# Patient Record
Sex: Male | Born: 1970 | Race: White | Hispanic: No | Marital: Single | Smoking: Current every day smoker
Health system: Southern US, Community
[De-identification: ages and names within clinical notes are randomized; demographics above are authoritative.]

## PROBLEM LIST (undated history)

## (undated) DIAGNOSIS — F209 Schizophrenia, unspecified: Secondary | ICD-10-CM

## (undated) DIAGNOSIS — G4730 Sleep apnea, unspecified: Secondary | ICD-10-CM

## (undated) DIAGNOSIS — E7800 Pure hypercholesterolemia, unspecified: Secondary | ICD-10-CM

## (undated) DIAGNOSIS — K219 Gastro-esophageal reflux disease without esophagitis: Secondary | ICD-10-CM

## (undated) DIAGNOSIS — F99 Mental disorder, not otherwise specified: Secondary | ICD-10-CM

## (undated) DIAGNOSIS — F32A Depression, unspecified: Secondary | ICD-10-CM

## (undated) DIAGNOSIS — J449 Chronic obstructive pulmonary disease, unspecified: Secondary | ICD-10-CM

## (undated) DIAGNOSIS — F319 Bipolar disorder, unspecified: Secondary | ICD-10-CM

## (undated) HISTORY — PX: CHOLECYSTECTOMY: SHX55

## (undated) HISTORY — PX: TONSILLECTOMY: SUR1361

---

## 2003-05-17 ENCOUNTER — Other Ambulatory Visit: Payer: Self-pay

## 2004-04-01 ENCOUNTER — Emergency Department: Payer: Self-pay | Admitting: Emergency Medicine

## 2009-09-05 ENCOUNTER — Inpatient Hospital Stay: Payer: Self-pay | Admitting: General Surgery

## 2009-09-05 IMAGING — CR DG CHOLANGIOGRAM OPERATIVE
1 series · 1 of 1 positions shown · non-contrast
Comparison: none

REASON FOR EXAM: lap chole with cholangiogram
COMMENTS:

[imported/digitized images]
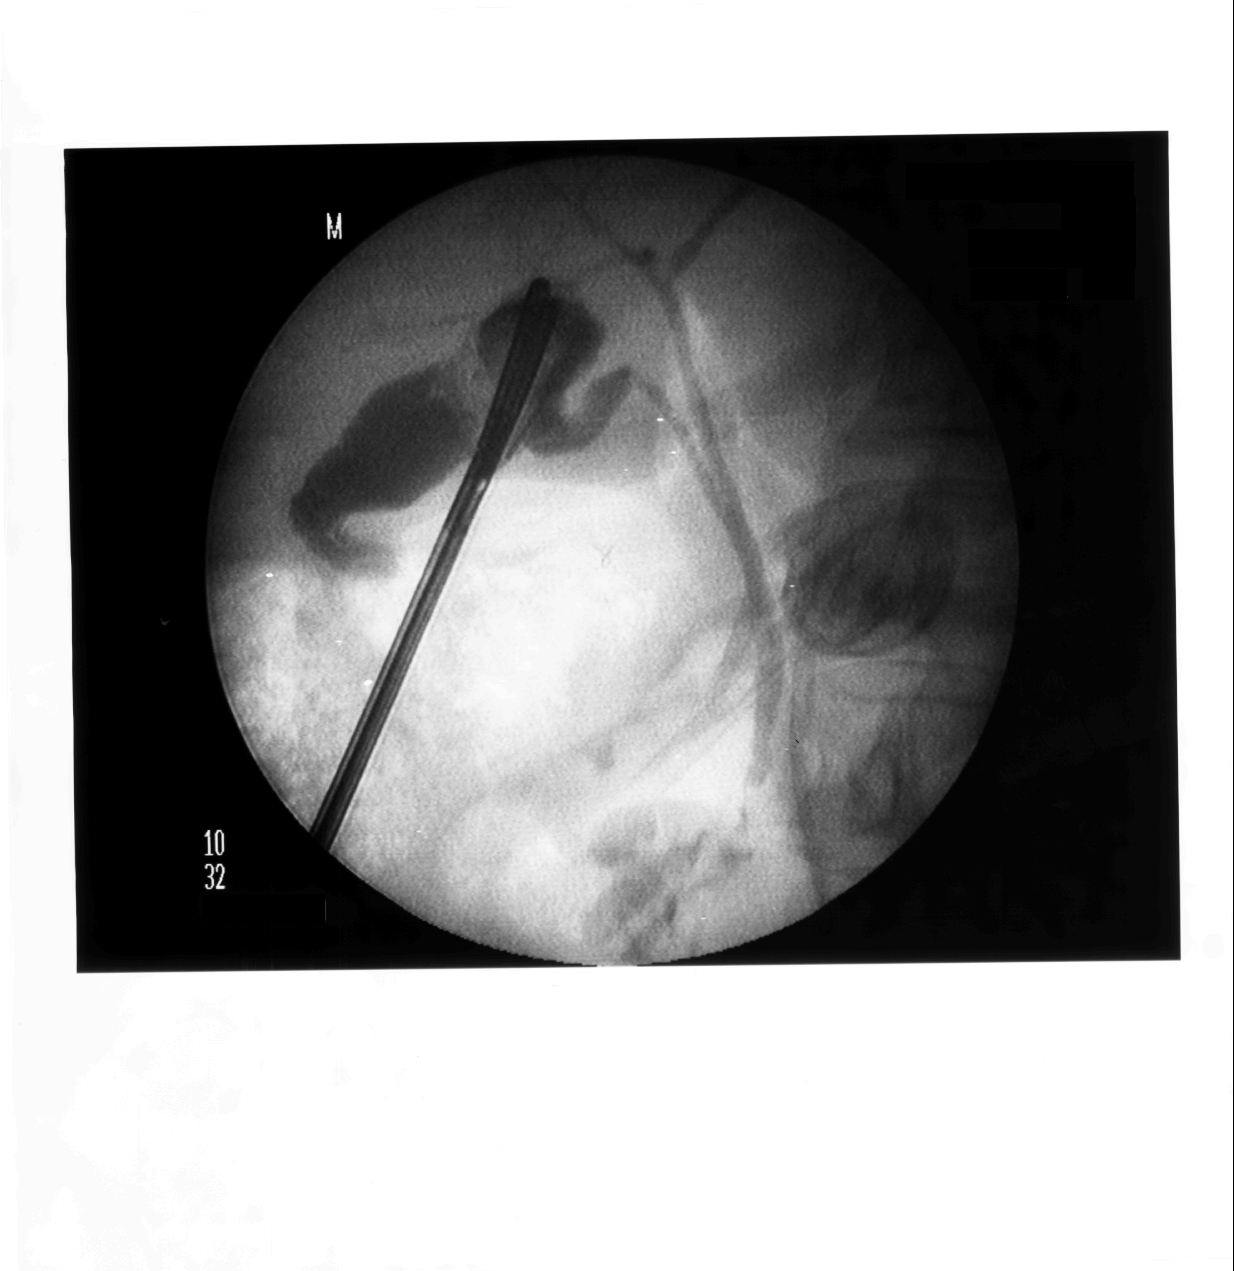

[1 of 1 positions shown; findings below may reference images not displayed]

PROCEDURE:     DXR - DXR CHOLANGIOGRAM OP (INITIAL)  - September 05, 2009  [DATE]

RESULT:     Laparoscopic cholangiogram demonstrates opacification of the
cystic duct, common hepatic duct, left and right hepatic ducts and common
bile duct. No definite filling defect or obstructive changes appreciated.
Contrast flows into the small bowel.
IMPRESSION: No obstruction or filling defect evident on the single
image submitted.

## 2011-06-30 IMAGING — CR DG ABDOMEN 3V
1 series · 5 of 5 positions shown · non-contrast
Comparison: none

REASON FOR EXAM: abd pain hx vomiting blood
COMMENTS:   May transport without cardiac monitor

[Series 1: view not recorded · 0.17mm/px · 5 of 5 slices shown]
[im 1/5]
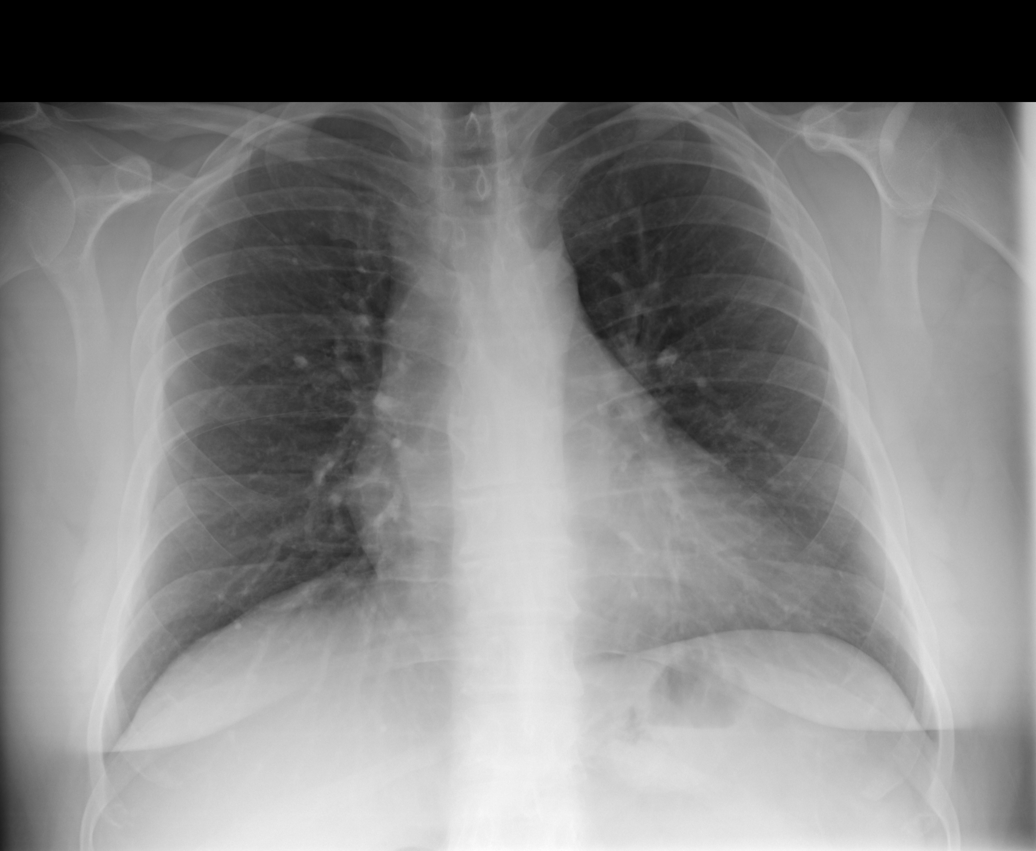
[im 2/5]
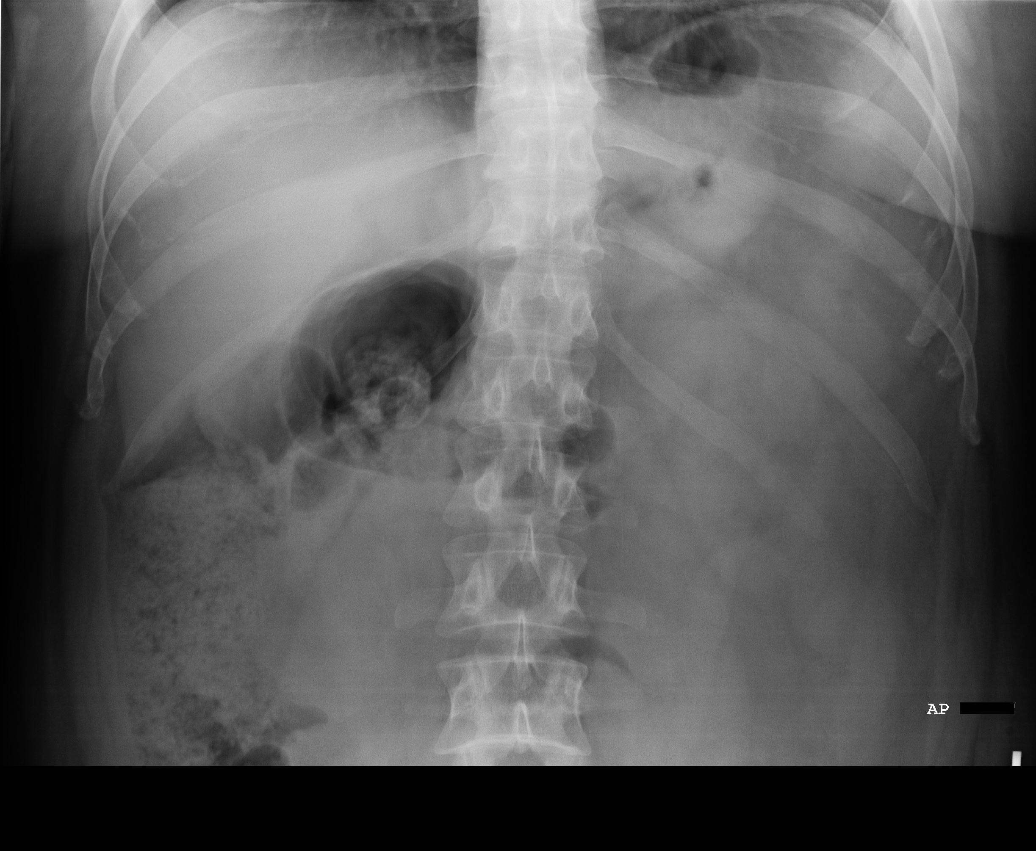
[im 3/5]
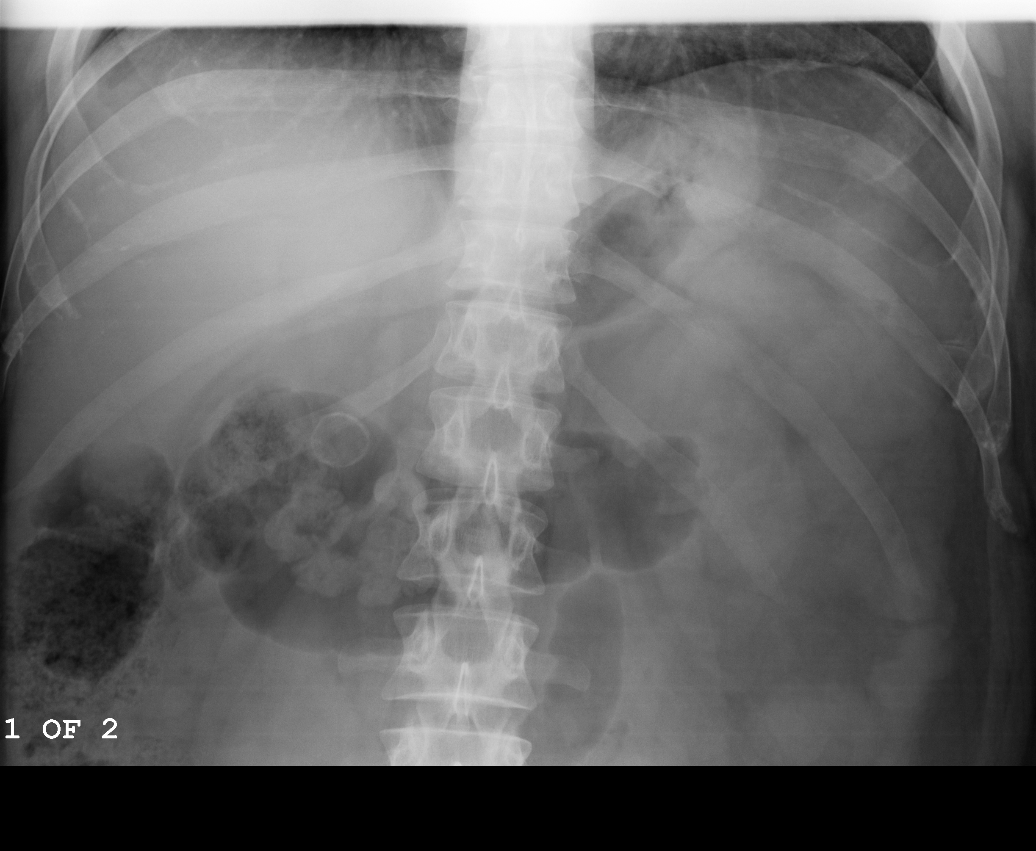
[im 4/5]
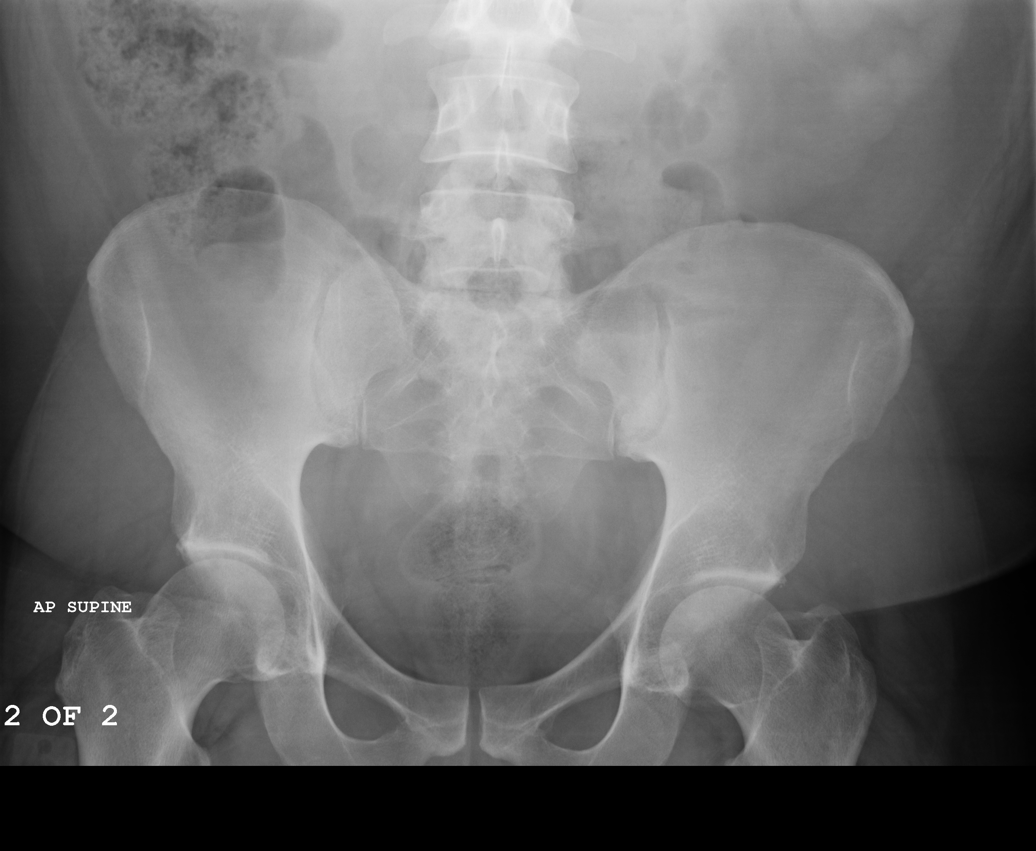
[im 5/5]
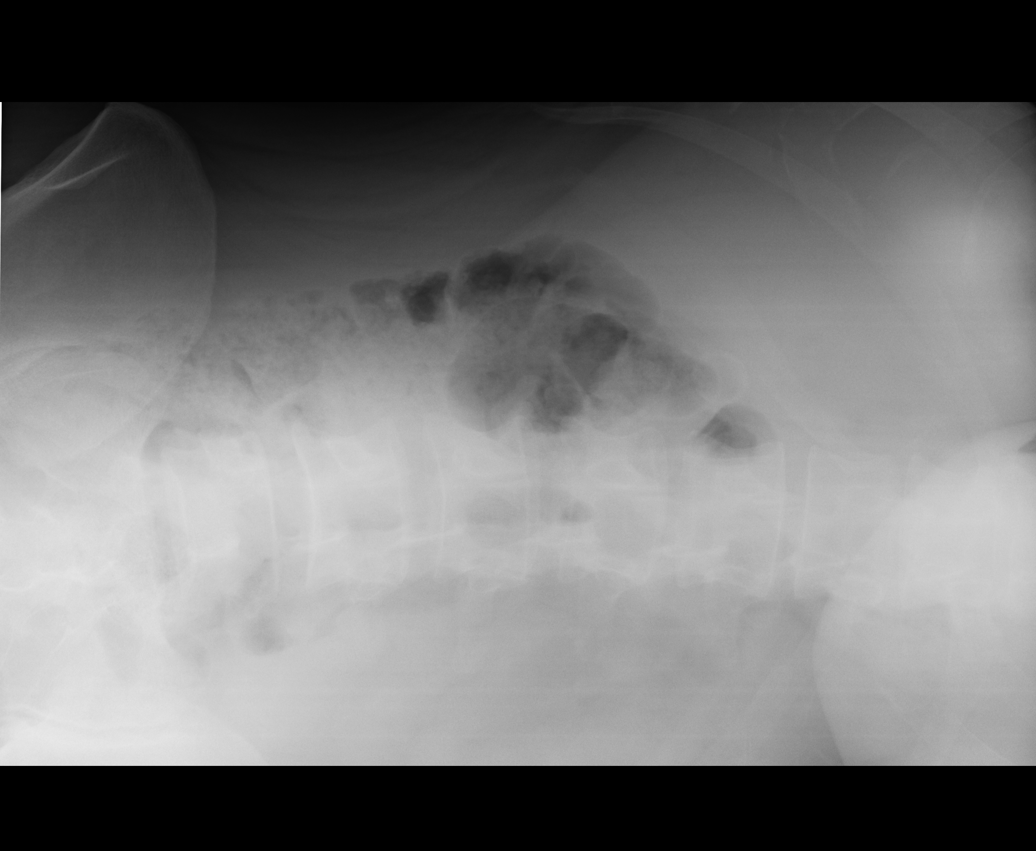

[5 of 5 positions shown; findings below may reference images not displayed]

PROCEDURE:     DXR - DXR ABDOMEN 3-WAY (INCL PA CXR)  - September 05, 2009 [DATE]

RESULT:     The lungs are clear. The cardiac silhouette appears to be
normal.

The bowel gas pattern shows a nondistended gastric bubble. There is a
moderately prominent amount of fecal material within the ascending colon and
transverse colon. There may be a well-circumscribed large gallstone
projecting over the twelfth rib. There is a small amount of air and stool in
the rectum. No abnormal small bowel distention or air-fluid level is seen
otherwise.
IMPRESSION: 1.     Possible cholelithiasis.
2.     Moderate amount of retained fecal material within the right colon. No
obstruction or perforation evident.

## 2011-06-30 IMAGING — US ABDOMEN ULTRASOUND LIMITED
1 series · 17 of 25 positions shown · non-contrast
Comparison: none

REASON FOR EXAM: abd pain, apparent gasll stone on xray
COMMENTS:

[Series 1: abdomen ultrasound limited · 17 of 52 slices shown]
[im 1/52]
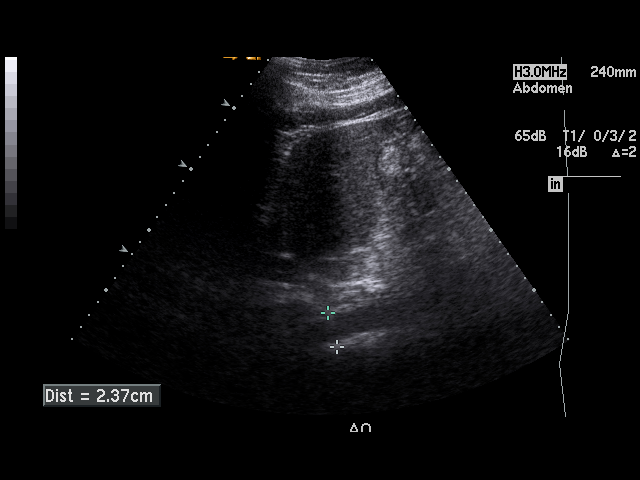
[im 5/52]
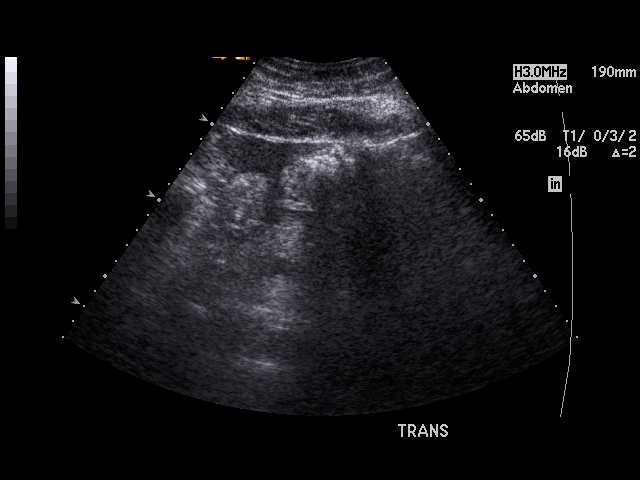
[im 7/52]
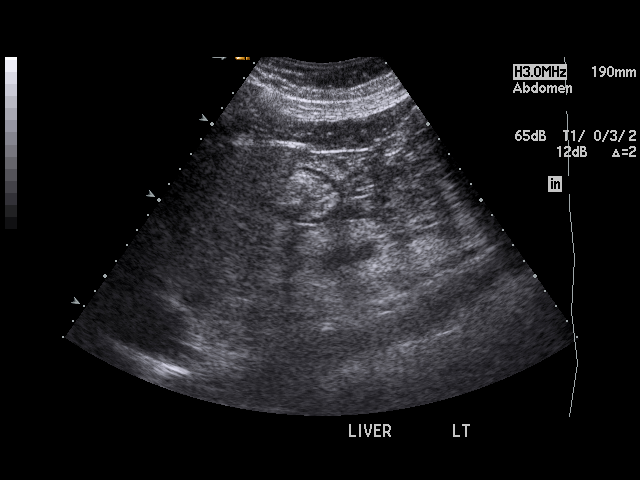
[im 11/52]
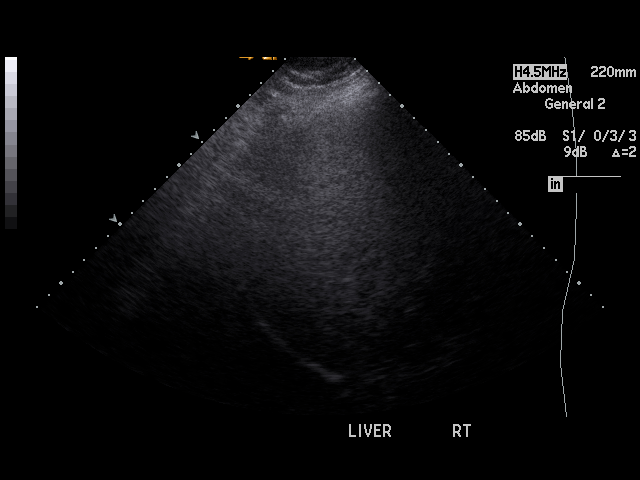
[im 13/52]
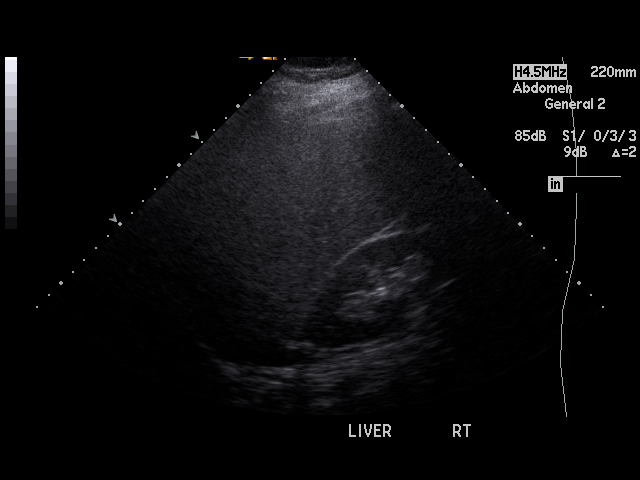
[im 18/52]
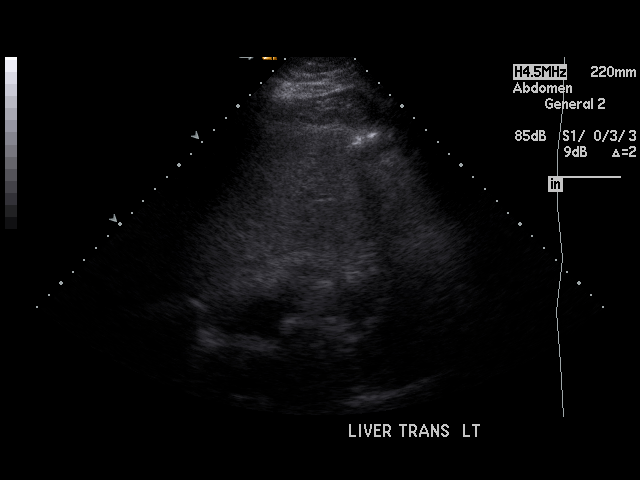
[im 20/52]
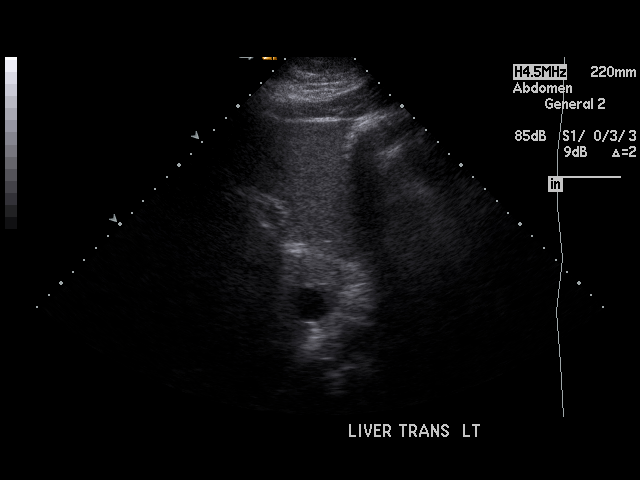
[im 24/52]
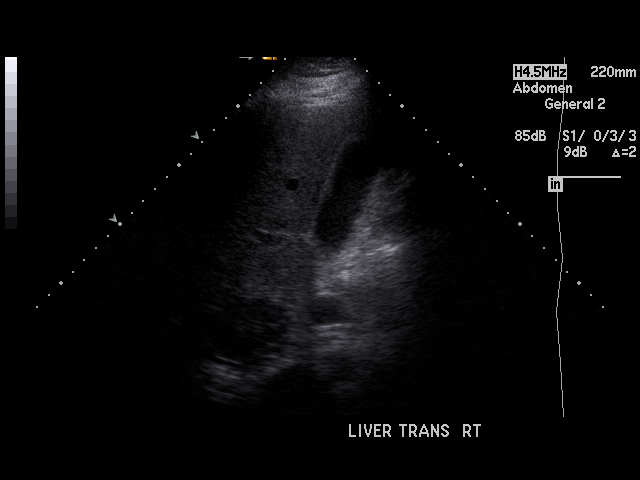
[im 26/52]
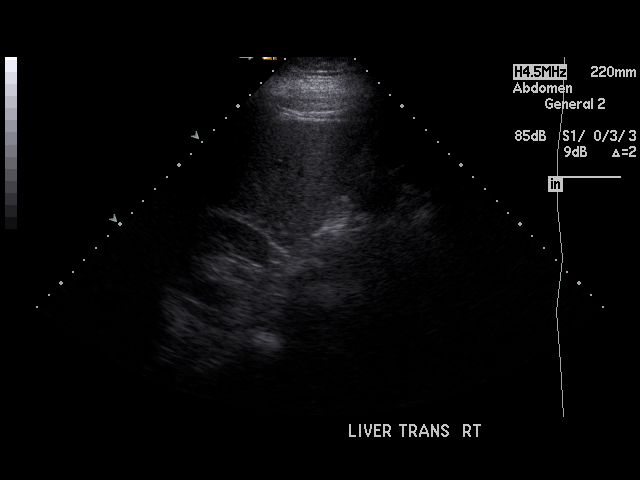
[im 28/52]
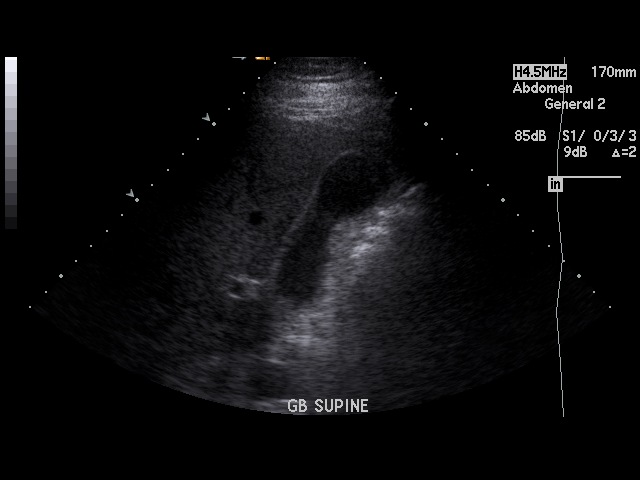
[im 32/52]
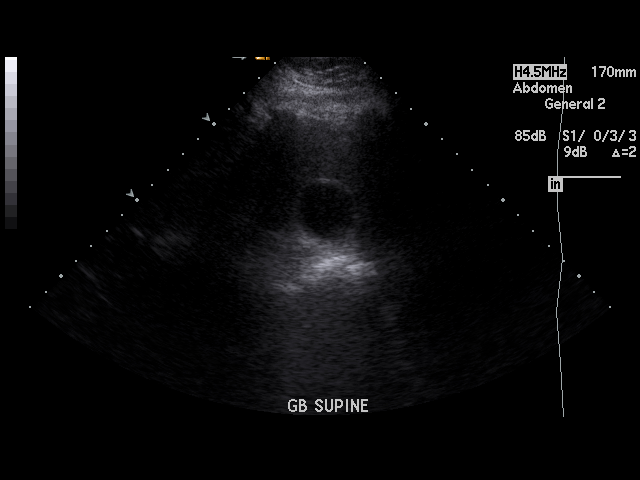
[im 35/52]
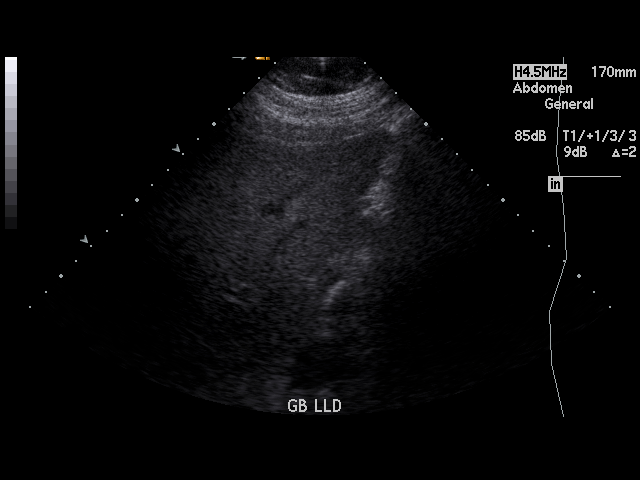
[im 39/52]
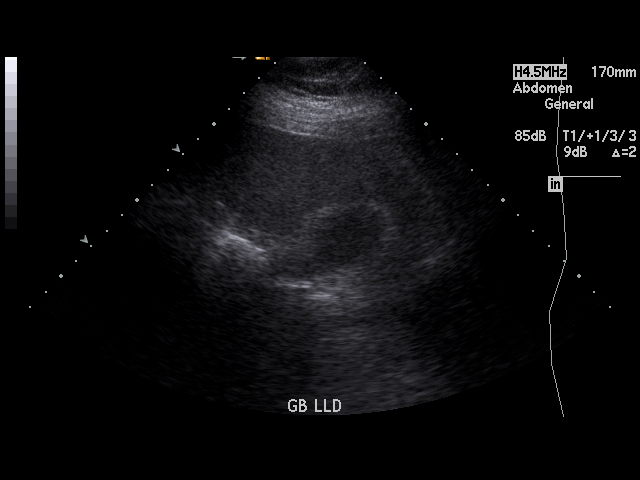
[im 41/52]
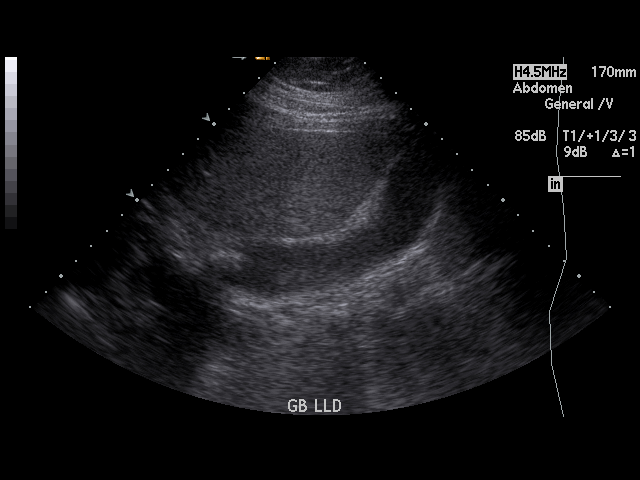
[im 45/52]
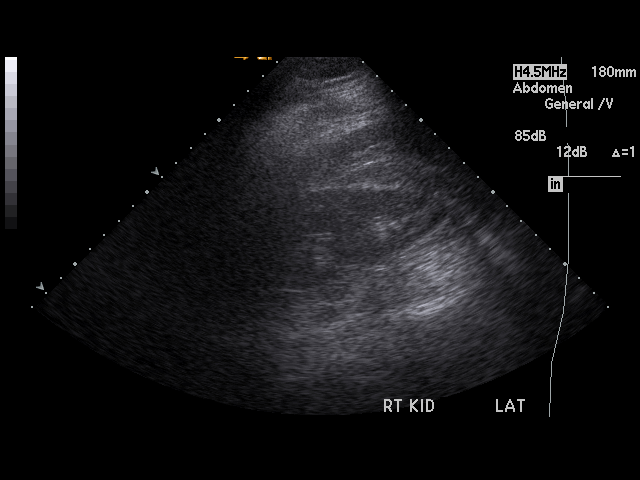
[im 47/52]
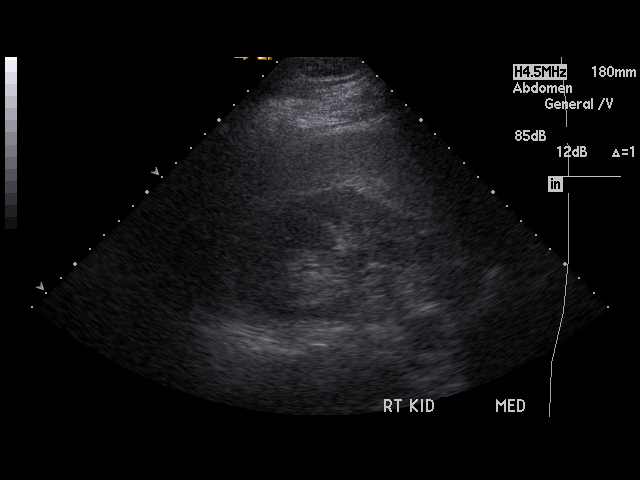
[im 52/52]
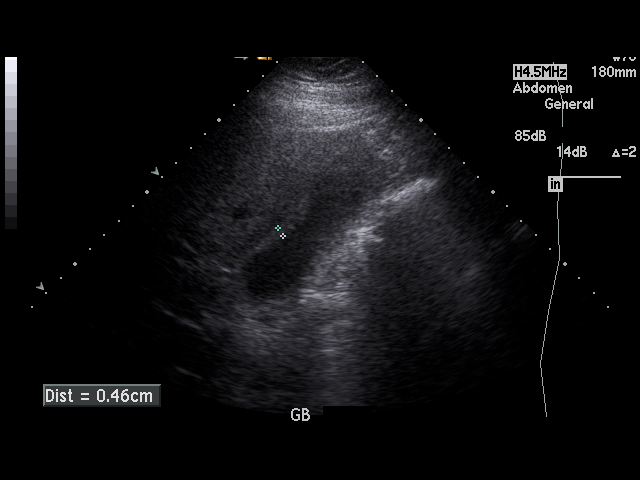

[17 of 25 positions shown; findings below may reference images not displayed]

PROCEDURE:     US  - US ABDOMEN LIMITED SURVEY  - September 05, 2009 [DATE]

RESULT:     Ultrasound of the right upper quadrant shows the visualized
portion of the abdominal aorta is normal area the pancreas is very poorly
seen because of overlying bowel gas. The liver echotexture is increased
consistent with diffuse fatty infiltration. There is a normal appearance of
the common bile duct. The diameters 2.7 mm. The gallbladder wall is
abnormally thickened from 4.2 to 4.6 mm. There is sludge present in the
gallbladder along with stones. No pericholecystic fluid is appreciated.
There is no sonographic Murphy's sign. There is a stone in the neck of the
gallbladder which did not move with changes in patient position. The right
kidney length is 12.36 cm. This appears normal. The portal venous flow
appears normal.
IMPRESSION: 1. Cholelithiasis possibly with an impacted stone in the gallbladder neck.
The wall is thickened. There is no pericholecystic fluid. No sonographic
Murphy's sign is present. There is increased echogenicity diffusely in the
liver consistent with fatty infiltration.

## 2015-01-30 ENCOUNTER — Encounter: Payer: Self-pay | Admitting: *Deleted

## 2015-01-30 ENCOUNTER — Emergency Department
Admission: EM | Admit: 2015-01-30 | Discharge: 2015-01-30 | Disposition: A | Discharge status: Home / Self Care | Payer: Medicaid Other | Attending: Emergency Medicine | Admitting: Emergency Medicine

## 2015-01-30 DIAGNOSIS — Y9289 Other specified places as the place of occurrence of the external cause: Secondary | ICD-10-CM | POA: Diagnosis not present

## 2015-01-30 DIAGNOSIS — Z23 Encounter for immunization: Secondary | ICD-10-CM | POA: Insufficient documentation

## 2015-01-30 DIAGNOSIS — Y998 Other external cause status: Secondary | ICD-10-CM | POA: Insufficient documentation

## 2015-01-30 DIAGNOSIS — Z720 Tobacco use: Secondary | ICD-10-CM | POA: Diagnosis not present

## 2015-01-30 DIAGNOSIS — W4904XA Ring or other jewelry causing external constriction, initial encounter: Secondary | ICD-10-CM | POA: Diagnosis not present

## 2015-01-30 DIAGNOSIS — S60444A External constriction of right ring finger, initial encounter: Secondary | ICD-10-CM | POA: Diagnosis not present

## 2015-01-30 DIAGNOSIS — S61214A Laceration without foreign body of right ring finger without damage to nail, initial encounter: Secondary | ICD-10-CM | POA: Insufficient documentation

## 2015-01-30 DIAGNOSIS — L089 Local infection of the skin and subcutaneous tissue, unspecified: Secondary | ICD-10-CM | POA: Diagnosis not present

## 2015-01-30 DIAGNOSIS — S6991XA Unspecified injury of right wrist, hand and finger(s), initial encounter: Secondary | ICD-10-CM | POA: Diagnosis present

## 2015-01-30 DIAGNOSIS — Y9389 Activity, other specified: Secondary | ICD-10-CM | POA: Diagnosis not present

## 2015-01-30 DIAGNOSIS — Q798 Other congenital malformations of musculoskeletal system: Secondary | ICD-10-CM

## 2015-01-30 DIAGNOSIS — S61219A Laceration without foreign body of unspecified finger without damage to nail, initial encounter: Secondary | ICD-10-CM

## 2015-01-30 HISTORY — DX: Mental disorder, not otherwise specified: F99

## 2015-01-30 MED ORDER — CEPHALEXIN 500 MG PO CAPS: 500.0000 mg | ORAL_CAPSULE | Freq: Four times a day (QID) | ORAL | Status: DC

## 2015-01-30 MED ORDER — TETANUS-DIPHTH-ACELL PERTUSSIS 5-2.5-18.5 LF-MCG/0.5 IM SUSP
0.5000 mL | Freq: Once | INTRAMUSCULAR | Status: AC
  Administered 2015-01-30: 0.5 mL via INTRAMUSCULAR
  Filled 2015-01-30: qty 0.5

## 2015-01-30 MED ORDER — TRAMADOL HCL 50 MG PO TABS: 50.0000 mg | ORAL_TABLET | Freq: Four times a day (QID) | ORAL | Status: AC | PRN

## 2015-01-30 MED ORDER — TETANUS-DIPHTH-ACELL PERTUSSIS 5-2.5-18.5 LF-MCG/0.5 IM SUSP
INTRAMUSCULAR | Status: AC
  Administered 2015-01-30: 0.5 mL via INTRAMUSCULAR
  Filled 2015-01-30: qty 0.5

## 2015-01-30 NOTE — ED Notes (Signed)
Patient has finger on right hand ring finger he put on there 2 weeks ago.  Patient has good circulation to his finger and rest of the hand, but the ring has made wound to palm at the base of the finger.

## 2015-01-30 NOTE — Discharge Instructions (Signed)
Warm soaks to hand twice a day. Clean daily with mild soap and water. Take all antibiotic until completely finished. Take pain medication only as directed. Follow-up with Dr. Doy Hutching next week.

## 2015-01-30 NOTE — ED Notes (Signed)
Dry dressing placed to right ring finger. Patient instructed to soak when he gets home and to keep it clean.  Discussed pain medication and antibiotic that was ordered as well.

## 2015-01-30 NOTE — ED Notes (Signed)
Pt has a ring on right ring finger, pt unable to remove ring due to swelling, pt is here for ring removal

## 2015-01-30 NOTE — ED Provider Notes (Signed)
[facility] Emergency Department Provider Note  ____________________________________________  Time seen: Approximately 9:21 AM  I have reviewed the triage vital signs and the nursing notes.   HISTORY  Chief Complaint Joint Swelling  HPI Cole Velazquez is a 44 y.o. male is here with complaint of right fourth finger pain. Patient states that his ring is cutting into his skin because his finger is swelling. This demonstrates waist for 2 weeks and he has not seen anybody prior to this.Patient states he was afraid to come to the emergency room but was encouraged by a friend to do so. He also states that he has not had a tetanus shot in over 10 years. Currently he reports that he is not have any pain unless someone touches his finger. He denies any fever or chills. He states that Dr. Doy Hutching is his PCP.   Past Medical History  Diagnosis Date  . Psychiatric problem     There are no active problems to display for this patient.   History reviewed. No pertinent past surgical history.  Current Outpatient Rx  Name  Route  Sig  Dispense  Refill  . cephALEXin (KEFLEX) 500 MG capsule   Oral   Take 1 capsule (500 mg total) by mouth 4 (four) times daily.   28 capsule   0   . traMADol (ULTRAM) 50 MG tablet   Oral   Take 1 tablet (50 mg total) by mouth every 6 (six) hours as needed.   20 tablet   0     Allergies Review of patient's allergies indicates no known allergies.  No family history on file.  Social History Social History  Substance Use Topics  . Smoking status: Current Every Day Smoker -- 0.50 packs/day  . Smokeless tobacco: None  . Alcohol Use: No    Review of Systems Constitutional: No fever/chills Cardiovascular: Denies chest pain. Respiratory: Denies shortness of breath. Gastrointestinal:  No nausea, no vomiting. Musculoskeletal: Negative for back pain. Skin: Negative for rash. Positive skin disruption secondary to ring. Neurological:  Negative for headaches, focal weakness or numbness. 10-point ROS otherwise negative.  ____________________________________________   PHYSICAL EXAM:  VITAL SIGNS: ED Triage Vitals  Enc Vitals Group     BP 01/30/15 0908 118/72 mmHg     Pulse Rate 01/30/15 0908 86     Resp 01/30/15 0908 20     Temp 01/30/15 0908 98 F (36.7 C)     Temp Source 01/30/15 0908 Oral     SpO2 01/30/15 0908 96 %     Weight 01/30/15 0908 230 lb (104.327 kg)     Height 01/30/15 0908 5\' 9"  (1.753 m)     Head Cir --      Peak Flow --      Pain Score --      Pain Loc --      Pain Edu? --      Excl. in [place]? --     Constitutional: Alert and oriented. Well appearing and in no acute distress. Eyes: Conjunctivae are normal. PERRL. EOMI. Head: Atraumatic. Nose: No congestion/rhinnorhea. Neck: No stridor.   Cardiovascular: Normal rate, regular rhythm. Grossly normal heart sounds.  Good peripheral circulation. Respiratory: Normal respiratory effort.  No retractions. Lungs CTAB. Gastrointestinal: Soft and nontender. No distention.  Musculoskeletal: Moves all extremities without any difficulty. Neurologic:  Normal speech and language. No gross focal neurologic deficits are appreciated. No gait instability. Skin:  Skin is warm. Right ring finger moderate edema present with a  class ring that cannot be taken off. There appears to be some superficial lacerations beneath the ring. Area is tender to deep palpation. There is minimal erythema present. Psychiatric: Mood and affect are normal. Speech and behavior are normal.  ____________________________________________   LABS (all labs ordered are listed, but only abnormal results are displayed)  Labs Reviewed - No data to display    PROCEDURES  Procedure(s) performed: None  Critical Care performed: No  ____________________________________________   INITIAL IMPRESSION / ASSESSMENT AND PLAN / ED COURSE  Pertinent labs & imaging results that were available  during my care of the patient were reviewed by me and considered in my medical decision making (see chart for details).  Ring was removed by [place] our tech with ring cutters. There was some purulent material underneath the ring. There are several superficial openings in the skin due to the ring cutting into his skin. Patient is placed on Keflex 500 mg 4 times a day for 7 days. He is also given a prescription for tramadol if needed for pain. He is to follow-up with Dr. Doy Hutching if any continued problems or return to the emergency room if any severe worsening of his symptoms. ____________________________________________   FINAL CLINICAL IMPRESSION(S) / ED DIAGNOSES  Final diagnoses:  Laceration of finger of right hand, initial encounter  Skin infection  Constriction ring of digit      Cole Hai, PA-C 01/30/15 1041  Delman Kitten, MD 01/30/15 [phone]

## 2019-08-23 ENCOUNTER — Emergency Department
Admission: EM | Admit: 2019-08-23 | Discharge: 2019-08-23 | Disposition: A | Discharge status: Home / Self Care | Payer: Medicaid Other | Attending: Emergency Medicine | Admitting: Emergency Medicine

## 2019-08-23 ENCOUNTER — Encounter: Payer: Self-pay | Admitting: Emergency Medicine

## 2019-08-23 ENCOUNTER — Other Ambulatory Visit: Payer: Self-pay

## 2019-08-23 ENCOUNTER — Emergency Department: Payer: Medicaid Other

## 2019-08-23 DIAGNOSIS — F17210 Nicotine dependence, cigarettes, uncomplicated: Secondary | ICD-10-CM | POA: Insufficient documentation

## 2019-08-23 DIAGNOSIS — B369 Superficial mycosis, unspecified: Secondary | ICD-10-CM | POA: Diagnosis not present

## 2019-08-23 DIAGNOSIS — N492 Inflammatory disorders of scrotum: Secondary | ICD-10-CM | POA: Diagnosis not present

## 2019-08-23 DIAGNOSIS — N433 Hydrocele, unspecified: Secondary | ICD-10-CM | POA: Diagnosis not present

## 2019-08-23 DIAGNOSIS — J449 Chronic obstructive pulmonary disease, unspecified: Secondary | ICD-10-CM | POA: Diagnosis not present

## 2019-08-23 DIAGNOSIS — N5089 Other specified disorders of the male genital organs: Secondary | ICD-10-CM

## 2019-08-23 DIAGNOSIS — N451 Epididymitis: Secondary | ICD-10-CM

## 2019-08-23 HISTORY — DX: Pure hypercholesterolemia, unspecified: E78.00

## 2019-08-23 HISTORY — DX: Chronic obstructive pulmonary disease, unspecified: J44.9

## 2019-08-23 HISTORY — DX: Bipolar disorder, unspecified: F31.9

## 2019-08-23 LAB — URINALYSIS, COMPLETE (UACMP) WITH MICROSCOPIC
Bacteria, UA: NONE SEEN
Bilirubin Urine: NEGATIVE
Glucose, UA: NEGATIVE mg/dL
Hgb urine dipstick: NEGATIVE
Ketones, ur: 5 mg/dL — AB
Nitrite: NEGATIVE
Protein, ur: NEGATIVE mg/dL
Specific Gravity, Urine: 1.017 (ref 1.005–1.030)
pH: 5 (ref 5.0–8.0)

## 2019-08-23 MED ORDER — CEPHALEXIN 500 MG PO CAPS: 500.0000 mg | ORAL_CAPSULE | Freq: Two times a day (BID) | ORAL | 0 refills | Status: DC

## 2019-08-23 MED ORDER — KETOCONAZOLE 2 % EX CREA: 1.0000 "application " | TOPICAL_CREAM | Freq: Every day | CUTANEOUS | 0 refills | Status: AC

## 2019-08-23 MED ORDER — CEPHALEXIN 500 MG PO CAPS
500.0000 mg | ORAL_CAPSULE | Freq: Four times a day (QID) | ORAL | 0 refills | Status: DC
Start: 2019-08-23 — End: 2019-08-27

## 2019-08-23 NOTE — ED Triage Notes (Addendum)
FIRST NURSE NOTE:Pt sent over from [facility] with c/o scrotal swelling and pain. Ambulatory without difficulty, c/o pain when sitting. Pt states injury occurred last week when moving heavy furniture.

## 2019-08-23 NOTE — ED Triage Notes (Signed)
Pt presents to ED via POV with c/o testicular swelling and pain. Pt states difficulty getting urine flow started. Pt denies other urinary symptoms. Pt states pain with movement. Pt states R testicle swelling more than left at this time. Pt visualized in NAD at this time.    VORB for UA and [place] from Dr. Corky Downs.

## 2019-08-23 NOTE — ED Notes (Signed)
Reports was lifting furniture on 5/5 and next day was having pain and swelling to right testicle. Patient reports did not come to have it looked at because he thought swelling and pain would resolve on its on. Pain has been consistent but denies increase in pain. U/s completed. patient awaiting results and plan of carr.

## 2019-08-23 NOTE — ED Provider Notes (Signed)
[facility] Emergency Department Provider Note   ____________________________________________    I have reviewed the triage vital signs and the nursing notes.   HISTORY  Chief Complaint Testicle Pain     HPI Cole Velazquez is a 49 y.o. male with history of bipolar disorder who presents with complaints of testicular swelling and pain. Patient describes swelling over the last several days. Denies injury to the area. No fevers or chills. Has not take anything for this. Describes swelling seems to be worse on the right side and he describes a tightness they are worse with pressure. Denies dysuria, no penile discharge. Has never had this before  Past Medical History:  Diagnosis Date  . Bipolar disorder ([place])   . COPD (chronic obstructive pulmonary disease) ([place])   . High cholesterol   . Psychiatric problem     There are no problems to display for this patient.   History reviewed. No pertinent surgical history.  Prior to Admission medications   Medication Sig Start Date End Date Taking? Authorizing Provider  cephALEXin (KEFLEX) 500 MG capsule Take 1 capsule (500 mg total) by mouth 4 (four) times daily. 01/30/15   [name], PA-C  traMADol (ULTRAM) 50 MG tablet Take 1 tablet (50 mg total) by mouth every 6 (six) hours as needed. 01/30/15   [name], PA-C     Allergies Patient has no known allergies.  No family history on file.  Social History Social History   Tobacco Use  . Smoking status: Current Every Day Smoker    Packs/day: 0.50  . Smokeless tobacco: Never Used  Substance Use Topics  . Alcohol use: No  . Drug use: Not on file    Review of Systems  Constitutional: No fever/chills Eyes: No visual changes.  ENT: No sore throat. Cardiovascular: Denies chest pain. Respiratory: Denies shortness of breath. Gastrointestinal: No abdominal pain.  No nausea, no vomiting.   Genitourinary: As above Musculoskeletal:  Negative for back pain. Skin: Negative for rash. Neurological: Negative for headaches   ____________________________________________   PHYSICAL EXAM:  VITAL SIGNS: ED Triage Vitals  Enc Vitals Group     BP 08/23/19 1104 124/82     Pulse Rate 08/23/19 1104 93     Resp 08/23/19 1104 16     Temp 08/23/19 1104 97.8 F (36.6 C)     Temp Source 08/23/19 1104 Oral     SpO2 08/23/19 1104 95 %     Weight 08/23/19 1104 117.9 kg (260 lb)     Height 08/23/19 1104 1.753 m (5\' 9" )     Head Circumference --      Peak Flow --      Pain Score 08/23/19 1112 4     Pain Loc --      Pain Edu? --      Excl. in [place]? --     Constitutional: Alert and oriented. Eyes: Conjunctivae are normal.   Mouth/Throat: Mucous membranes are moist.    Cardiovascular: Normal rate, regular rhythm. Grossly normal heart sounds.  Good peripheral circulation. Respiratory: Normal respiratory effort.  No retractions. Lungs CTAB. Gastrointestinal: Soft and nontender. No distention.  No CVA tenderness. Genitourinary: Mild scrotal swelling, right greater than left, mild erythema, mild tenderness on the right, no penile discharge, no crepitus, no abscess or fluctuance Musculoskeletal:  Warm and well perfused Neurologic:  Normal speech and language. No gross focal neurologic deficits are appreciated.  Skin:  Skin is warm, dry and intact.  Psychiatric: Mood and affect are normal. Speech and behavior are normal.  ____________________________________________   LABS (all labs ordered are listed, but only abnormal results are displayed)  Labs Reviewed  URINALYSIS, COMPLETE (UACMP) WITH MICROSCOPIC - Abnormal; Notable for the following components:      Result Value   Color, Urine YELLOW (*)    APPearance HAZY (*)    Ketones, ur 5 (*)    Leukocytes,Ua TRACE (*)    All other components within normal limits    ____________________________________________  EKG  None ____________________________________________  RADIOLOGY  Ultrasound testicles negative for acute finding ____________________________________________   PROCEDURES  Procedure(s) performed: No  Procedures   Critical Care performed: No ____________________________________________   INITIAL IMPRESSION / ASSESSMENT AND PLAN / ED COURSE  Pertinent labs & imaging results that were available during my care of the patient were reviewed by me and considered in my medical decision making (see chart for details).  Patient presents with swelling as described above. Differential includes epididymitis, orchitis, urinary tract infection, cellulitis.  Ultrasound is negative for acute findings, I suspect mild epididymitis given evidence of urinary tract infection. We will start the patient on Keflex to cover cellulitis and UTI. Close follow-up with urology. Strict return precautions discussed.    ____________________________________________   FINAL CLINICAL IMPRESSION(S) / ED DIAGNOSES  Final diagnoses:  Testicle swelling        Note:  This document was prepared using Dragon voice recognition software and may include unintentional dictation errors.   Lavonia Drafts, MD 08/23/19 1451

## 2019-08-26 NOTE — Progress Notes (Signed)
08/27/19 3:15 PM   Cole Velazquez 01/01/1971 [id number]  Referring provider: Idelle Crouch, MD [facility] [name],  [place] [address] No chief complaint on file.   HPI: Cole Velazquez is a 49 y.o. M who presents today for the evaluation and management of epididymitis.   Visited ED on 08/23/19 c/o of testicular swelling and pain. He described his swelling to be worse on the right side. No prior hx of this event.  He was treated w/ Keflex to cover cellulitis and UTI. No urine culture was sent.   F/u Scrotal US revealed large septated right sided hydrocele and right sided scrotal soft tissue swelling. Small left hydrocele noted.   He reports of testicular swelling and pain onset 2 weeks ago. He reports of still taking antibiotics from ED however not completely resolved. He reports of his exam not improving but his pain is. He denies gross hematuria or hx of diabetes.  PMH: Past Medical History:  Diagnosis Date  . Bipolar disorder ([place])   . COPD (chronic obstructive pulmonary disease) ([place])   . High cholesterol   . Psychiatric problem     Surgical History: No past surgical history on file.  Home Medications:  Allergies as of 08/27/2019   No Known Allergies     Medication List       Accurate as of Aug 27, 2019 11:59 PM. If you have any questions, ask your nurse or doctor.        ARIPiprazole 30 MG tablet Commonly known as: ABILIFY Take by mouth.   cephALEXin 500 MG capsule Commonly known as: KEFLEX Take 1 capsule (500 mg total) by mouth 2 (two) times daily. What changed: Another medication with the same name was removed. Continue taking this medication, and follow the directions you see here. Changed by: Hollice Espy, MD   ketoconazole 2 % cream Commonly known as: NIZORAL Apply 1 application topically daily.   Multi-Vitamin tablet Take by mouth.   sulfamethoxazole-trimethoprim 800-160 MG tablet Commonly known as: BACTRIM  DS Take 1 tablet by mouth every 12 (twelve) hours. Started by: Hollice Espy, MD   traMADol 50 MG tablet Commonly known as: Ultram Take 1 tablet (50 mg total) by mouth every 6 (six) hours as needed.   venlafaxine XR 150 MG 24 hr capsule Commonly known as: EFFEXOR-XR Take 150 mg by mouth every morning.       Allergies: No Known Allergies  Family History: No family history on file.  Social History:  reports that he has been smoking. He has been smoking about 0.50 packs per day. He has never used smokeless tobacco. He reports that he does not drink alcohol. No history on file for drug.   Physical Exam: BP 134/84   Pulse 95   Ht 5\' 9"  (1.753 m)   Wt 250 lb (113.4 kg)   BMI 36.92 kg/m   Constitutional:  Alert and oriented, No acute distress.  Hygiene suboptimal. HEENT: [place] AT, moist mucus membranes.  Trachea midline, no masses. Cardiovascular: No clubbing, cyanosis, or edema. Respiratory: Normal respiratory effort, no increased work of breathing. GU: No CVA tenderness. Right hemiscrotum enlarged w/ tense hydrocele and some slight induration of scrotal skin somewhat fixed on anterior surface to the hydrocele. No fluctuance or scrotal edema appreciated. No abscess appreciated. Left testicle is palpable.  Lymph: No inguinal lymphadenopathy. Skin: No rashes, bruises or suspicious lesions. Neurologic: Grossly intact, no focal deficits, moving all 4 extremities. Psychiatric: Normal mood and  affect.  Pertinent Imaging: CLINICAL DATA:  Right scrotal swelling for 10 days.  EXAM: SCROTAL ULTRASOUND  DOPPLER ULTRASOUND OF THE TESTICLES  TECHNIQUE: Complete ultrasound examination of the testicles, epididymis, and other scrotal structures was performed. Color and spectral Doppler ultrasound were also utilized to evaluate blood flow to the testicles.  COMPARISON:  None.  FINDINGS: Right testicle  Measurements: 4.0 x 3.1 x 2.7 cm. No mass or  microlithiasis visualized.  Left testicle  Measurements: 3.2 x 1.7 x 3.0 cm. No mass or microlithiasis visualized.  Right epididymis:  Not discretely visualized.  Left epididymis:  Not discretely visualized.  Hydrocele:  Large septated right hydrocele.  Small left hydrocele.  Varicocele:  None visualized.  Pulsed Doppler interrogation of both testes demonstrates normal low resistance arterial and venous waveforms bilaterally.  Right-sided scrotal soft tissue swelling. No visualized inguinal hernia.  IMPRESSION: 1. No acute finding. No evidence of testicular torsion or of epididymitis/orchitis. 2. No testicular masses. 3. Large septated right sided hydrocele and right-sided scrotal soft tissue swelling. Small left hydrocele. No other abnormalities. 4. No sonographic evidence of a hernia.   Electronically Signed   By: Lajean Manes M.D.   On: 08/23/2019 12:37  I have personally reviewed the images and agree with radiologist interpretation.  Results for orders placed or performed in visit on 08/27/19  Microscopic Examination   URINE  Result Value Ref Range   WBC, UA 0-5 0 - 5 /hpf   RBC 0-2 0 - 2 /hpf   Epithelial Cells (non renal) 0-10 0 - 10 /hpf   Casts Present (A) None seen /lpf   Cast Type Hyaline casts N/A   Bacteria, UA None seen None seen/Few  Urinalysis, Complete  Result Value Ref Range   Specific Gravity, UA 1.025 1.005 - 1.030   pH, UA 6.0 5.0 - 7.5   Color, UA Yellow Yellow   Appearance Ur Clear Clear   Leukocytes,UA Negative Negative   Protein,UA Negative Negative/Trace   Glucose, UA Negative Negative   Ketones, UA Negative Negative   RBC, UA Trace (A) Negative   Bilirubin, UA Negative Negative   Urobilinogen, Ur 2.0 (H) 0.2 - 1.0 mg/dL   Nitrite, UA Negative Negative   Microscopic Examination See below:      Assessment & Plan:    1. Scrotal cellulitis /right epididymoorchitis Switched abx from Keflex to Bactrim to cover  MRSA Continue scrotal support Symptomatically improving although exam is relatively stable based on patient's own report Very strict warning instructions given  Return in 2 weeks for recheck or sooner if symptoms worsen.   I have reviewed the above documentation for accuracy and completeness, and I agree with the above.     [facility] [address] [place], [place] [address] [phone]  I, Lucas Mallow, am acting as a scribe for Dr. Hollice Espy,  I have reviewed the above documentation for accuracy and completeness, and I agree with the above.   Hollice Espy, MD

## 2019-08-27 ENCOUNTER — Ambulatory Visit (INDEPENDENT_AMBULATORY_CARE_PROVIDER_SITE_OTHER): Payer: Medicaid Other | Admitting: Urology

## 2019-08-27 ENCOUNTER — Other Ambulatory Visit: Payer: Self-pay

## 2019-08-27 VITALS — BP 134/84 | HR 95 | Ht 69.0 in | Wt 250.0 lb

## 2019-08-27 DIAGNOSIS — N453 Epididymo-orchitis: Secondary | ICD-10-CM

## 2019-08-27 DIAGNOSIS — N492 Inflammatory disorders of scrotum: Secondary | ICD-10-CM

## 2019-08-27 MED ORDER — SULFAMETHOXAZOLE-TRIMETHOPRIM 800-160 MG PO TABS: 1.0000 | ORAL_TABLET | Freq: Two times a day (BID) | ORAL | 0 refills | Status: DC

## 2019-08-28 LAB — MICROSCOPIC EXAMINATION: Bacteria, UA: NONE SEEN

## 2019-08-28 LAB — URINALYSIS, COMPLETE
Bilirubin, UA: NEGATIVE
Glucose, UA: NEGATIVE
Ketones, UA: NEGATIVE
Leukocytes,UA: NEGATIVE
Nitrite, UA: NEGATIVE
Protein,UA: NEGATIVE
Specific Gravity, UA: 1.025 (ref 1.005–1.030)
Urobilinogen, Ur: 2 mg/dL — ABNORMAL HIGH (ref 0.2–1.0)
pH, UA: 6 (ref 5.0–7.5)

## 2019-09-10 NOTE — Progress Notes (Signed)
09/11/19  4:29 PM   Cole Velazquez 1970/08/02 [id number]  Referring provider: Idelle Crouch, MD [facility] [place],  [place] [address] Chief Complaint  Patient presents with  . cellulitis of scrotum    HPI: Cole Velazquez is a 49 y.o. M who presents today for a 2 week f/u for scrotal check.   Visited ED on 08/23/19 c/o of testicular swelling and pain. He described his swelling to be worse on the right side. No prior hx of this event.  He was treated w/ Keflex to cover cellulitis and UTI. No urine culture was sent. Scrotal US revealed large septated right sided hydrocele and right sided scrotal soft tissue swelling. Small left hydrocele noted.   He followed up in clinic with Dr. Erlene Quan on 08/27/2019.  He was switched to ED prescribed Keflex to Bactrim to cover MRSA and was advised to continue scrotal support with plans for symptom recheck in 2 weeks.  Today, patient reports significant improvement in his symptoms since his last clinic visit.  He reports resolution in pain and significantly decreased scrotal swelling that he attributes to antibiotics.  He denies fever, chills, nausea, and vomiting.  He has been inconsistently utilizing scrotal support techniques including compressive underwear and scrotal elevation.  He has no acute concerns today.  Additionally, patient reports no discomfort associated with his right hydrocele.  PMH: Past Medical History:  Diagnosis Date  . Bipolar disorder ([place])   . COPD (chronic obstructive pulmonary disease) ([place])   . High cholesterol   . Psychiatric problem     Surgical History: No past surgical history on file.  Home Medications:  Allergies as of 09/11/2019   No Known Allergies     Medication List       Accurate as of September 11, 2019  4:29 PM. If you have any questions, ask your nurse or doctor.        STOP taking these medications   cephALEXin 500 MG capsule Commonly known as: KEFLEX Stopped by:  Debroah Loop, PA-C   sulfamethoxazole-trimethoprim 800-160 MG tablet Commonly known as: BACTRIM DS Stopped by: Debroah Loop, PA-C     TAKE these medications   ARIPiprazole 30 MG tablet Commonly known as: ABILIFY Take by mouth.   ketoconazole 2 % cream Commonly known as: NIZORAL Apply 1 application topically daily.   Multi-Vitamin tablet Take by mouth.   traMADol 50 MG tablet Commonly known as: Ultram Take 1 tablet (50 mg total) by mouth every 6 (six) hours as needed.   venlafaxine XR 150 MG 24 hr capsule Commonly known as: EFFEXOR-XR Take 150 mg by mouth every morning.       Allergies: No Known Allergies  Family History: No family history on file.  Social History:  reports that he has been smoking. He has been smoking about 0.50 packs per day. He has never used smokeless tobacco. He reports that he does not drink alcohol. No history on file for drug.   Physical Exam: BP 138/88   Pulse 75   Ht 5\' 9"  (1.753 m)   Wt 250 lb (113.4 kg)   BMI 36.92 kg/m   Constitutional:  Alert and oriented, No acute distress. HEENT: [place] AT, moist mucus membranes.  Trachea midline, no masses. Cardiovascular: No clubbing, cyanosis, or edema. Respiratory: Normal respiratory effort, no increased work of breathing. GU: Enlarged right testicle consistent with hydrocele, scrotal skin remains fixed on the anterior surface of the hydrocele.  No fluctuance or  scrotal edema noted.  Scrotum is nontender throughout.  Left testicle also palpable. Skin: No rashes, bruises or suspicious lesions. Neurologic: Grossly intact, no focal deficits, moving all 4 extremities. Psychiatric: Normal mood and affect.  Assessment & Plan:   1. Cellulitis of scrotum Resolved, no further intervention indicated.  Counseled patient to follow-up as needed.  He expressed understanding.  2. Right hydrocele Counseled patient that hydrocele does not require intervention if it is not causing him  problems.  Explained that we may consider hydrocelectomy in the future if he develops discomfort associated with hydrocele.  He expressed understanding.  Return if symptoms worsen or fail to improve.  [place] [address] [place], [place] [address] [phone]

## 2019-09-11 ENCOUNTER — Ambulatory Visit (INDEPENDENT_AMBULATORY_CARE_PROVIDER_SITE_OTHER): Payer: Medicaid Other | Admitting: Physician Assistant

## 2019-09-11 ENCOUNTER — Other Ambulatory Visit: Payer: Self-pay

## 2019-09-11 VITALS — BP 138/88 | HR 75 | Ht 69.0 in | Wt 250.0 lb

## 2019-09-11 DIAGNOSIS — N492 Inflammatory disorders of scrotum: Secondary | ICD-10-CM

## 2019-09-11 DIAGNOSIS — N433 Hydrocele, unspecified: Secondary | ICD-10-CM | POA: Diagnosis not present

## 2020-07-09 LAB — COLOGUARD: COLOGUARD: POSITIVE — AB

## 2020-10-22 ENCOUNTER — Encounter: Payer: Self-pay | Admitting: *Deleted

## 2020-10-25 ENCOUNTER — Ambulatory Visit: Payer: Medicaid Other | Admitting: Anesthesiology

## 2020-10-25 ENCOUNTER — Ambulatory Visit
Admission: RE | Admit: 2020-10-25 | Discharge: 2020-10-25 | Disposition: A | Discharge status: Home / Self Care | Payer: Medicaid Other | Attending: Gastroenterology | Admitting: Gastroenterology

## 2020-10-25 ENCOUNTER — Encounter
Admission: RE | Disposition: A | Discharge status: Home / Self Care | Payer: Self-pay | Source: Home / Self Care | Attending: Gastroenterology

## 2020-10-25 DIAGNOSIS — K635 Polyp of colon: Secondary | ICD-10-CM | POA: Insufficient documentation

## 2020-10-25 DIAGNOSIS — J449 Chronic obstructive pulmonary disease, unspecified: Secondary | ICD-10-CM | POA: Diagnosis not present

## 2020-10-25 DIAGNOSIS — Z79899 Other long term (current) drug therapy: Secondary | ICD-10-CM | POA: Insufficient documentation

## 2020-10-25 DIAGNOSIS — Z9049 Acquired absence of other specified parts of digestive tract: Secondary | ICD-10-CM | POA: Diagnosis not present

## 2020-10-25 DIAGNOSIS — D125 Benign neoplasm of sigmoid colon: Secondary | ICD-10-CM | POA: Insufficient documentation

## 2020-10-25 DIAGNOSIS — K640 First degree hemorrhoids: Secondary | ICD-10-CM | POA: Insufficient documentation

## 2020-10-25 DIAGNOSIS — R195 Other fecal abnormalities: Secondary | ICD-10-CM | POA: Diagnosis present

## 2020-10-25 DIAGNOSIS — D123 Benign neoplasm of transverse colon: Secondary | ICD-10-CM | POA: Diagnosis not present

## 2020-10-25 HISTORY — DX: Depression, unspecified: F32.A

## 2020-10-25 HISTORY — PX: COLONOSCOPY WITH PROPOFOL: SHX5780

## 2020-10-25 HISTORY — DX: Schizophrenia, unspecified: F20.9

## 2020-10-25 HISTORY — DX: Sleep apnea, unspecified: G47.30

## 2020-10-25 HISTORY — DX: Gastro-esophageal reflux disease without esophagitis: K21.9

## 2020-10-25 SURGERY — COLONOSCOPY WITH PROPOFOL
Anesthesia: General

## 2020-10-25 MED ORDER — SODIUM CHLORIDE 0.9 % IV SOLN: INTRAVENOUS | Status: DC

## 2020-10-25 MED ORDER — PROPOFOL 500 MG/50ML IV EMUL
INTRAVENOUS | Status: DC | PRN
  Administered 2020-10-25: 200 ug/kg/min via INTRAVENOUS

## 2020-10-25 MED ORDER — PROPOFOL 10 MG/ML IV BOLUS
INTRAVENOUS | Status: DC | PRN
  Administered 2020-10-25: 60 mg via INTRAVENOUS
  Administered 2020-10-25: 20 mg via INTRAVENOUS

## 2020-10-25 NOTE — Op Note (Signed)
[facility] Gastroenterology Patient Name: Cole Velazquez Procedure Date: 10/25/2020 1:16 PM MRN: [id number] Account #: [id number] Date of Birth: 1970-08-11 Admit Type: Outpatient Age: 50 Room: [facility] ENDO ROOM 3 Gender: Male Note Status: Finalized Procedure:             Colonoscopy Indications:           Positive Cologuard test Providers:             Andrey Farmer MD, MD Medicines:             Monitored Anesthesia Care Complications:         No immediate complications. Estimated blood loss:                         Minimal. Procedure:             Pre-Anesthesia Assessment:                        - Prior to the procedure, a History and Physical was                         performed, and patient medications and allergies were                         reviewed. The patient is competent. The risks and                         benefits of the procedure and the sedation options and                         risks were discussed with the patient. All questions                         were answered and informed consent was obtained.                         Patient identification and proposed procedure were                         verified by the physician, the nurse, the anesthetist                         and the technician in the endoscopy suite. Mental                         Status Examination: alert and oriented. Airway                         Examination: normal oropharyngeal airway and neck                         mobility. Respiratory Examination: clear to                         auscultation. CV Examination: normal. Prophylactic                         Antibiotics: The patient does not require prophylactic  antibiotics. Prior Anticoagulants: The patient has                         taken no previous anticoagulant or antiplatelet                         agents. ASA Grade Assessment: III - A patient with                         severe systemic  disease. After reviewing the risks and                         benefits, the patient was deemed in satisfactory                         condition to undergo the procedure. The anesthesia                         plan was to use monitored anesthesia care (MAC).                         Immediately prior to administration of medications,                         the patient was re-assessed for adequacy to receive                         sedatives. The heart rate, respiratory rate, oxygen                         saturations, blood pressure, adequacy of pulmonary                         ventilation, and response to care were monitored                         throughout the procedure. The physical status of the                         patient was re-assessed after the procedure.                        After obtaining informed consent, the colonoscope was                         passed under direct vision. Throughout the procedure,                         the patient's blood pressure, pulse, and oxygen                         saturations were monitored continuously. The                         Colonoscope was introduced through the anus and                         advanced to the the cecum, identified by appendiceal  orifice and ileocecal valve. The colonoscopy was                         performed without difficulty. The patient tolerated                         the procedure well. The quality of the bowel                         preparation was adequate to identify polyps. Findings:      The perianal and digital rectal examinations were normal.      A 5 mm polyp was found in the hepatic flexure. The polyp was sessile.       The polyp was removed with a cold snare. Resection and retrieval were       complete. Estimated blood loss was minimal.      Two semi-pedunculated polyps were found in the sigmoid colon. The polyps       were 4 to 5 mm in size. These polyps were removed  with a cold snare.       Resection and retrieval were complete.      A 10 mm polyp was found in the sigmoid colon. The polyp was       pedunculated. The polyp was removed with a hot snare. Resection and       retrieval were complete. Estimated blood loss was minimal.      A 2 mm polyp was found in the recto-sigmoid colon. The polyp was       sessile. The polyp was removed with a cold snare. Resection and       retrieval were complete. Estimated blood loss was minimal.      Internal hemorrhoids were found during retroflexion. The hemorrhoids       were Grade I (internal hemorrhoids that do not prolapse).      The exam was otherwise without abnormality on direct and retroflexion       views. Impression:            - One 5 mm polyp at the hepatic flexure, removed with                         a cold snare. Resected and retrieved.                        - Two 4 to 5 mm polyps in the sigmoid colon, removed                         with a cold snare. Resected and retrieved.                        - One 10 mm polyp in the sigmoid colon, removed with a                         hot snare. Resected and retrieved.                        - One 2 mm polyp at the recto-sigmoid colon, removed                         with  a cold snare. Resected and retrieved.                        - Internal hemorrhoids.                        - The examination was otherwise normal on direct and                         retroflexion views. Recommendation:        - Discharge patient to home.                        - Resume previous diet.                        - Continue present medications.                        - Await pathology results.                        - Repeat colonoscopy in 3 years for surveillance.                        - Return to referring physician as previously                         scheduled. Procedure Code(s):     --- Professional ---                        [phone], Colonoscopy, flexible; with removal  of                         tumor(s), polyp(s), or other lesion(s) by snare                         technique Diagnosis Code(s):     --- Professional ---                        K63.5, Polyp of colon                        K64.0, First degree hemorrhoids                        R19.5, Other fecal abnormalities CPT copyright 2019 American Medical Association. All rights reserved. The codes documented in this report are preliminary and upon coder review may  be revised to meet current compliance requirements. Andrey Farmer MD, MD 10/25/2020 2:17:10 PM Number of Addenda: 0 Note Initiated On: 10/25/2020 1:16 PM Scope Withdrawal Time: 0 hours 21 minutes 38 seconds  Total Procedure Duration: 0 hours 30 minutes 3 seconds  Estimated Blood Loss:  Estimated blood loss was minimal.      [facility]

## 2020-10-25 NOTE — Transfer of Care (Signed)
Immediate Anesthesia Transfer of Care Note  Patient: Cole Velazquez  Procedure(s) Performed: COLONOSCOPY WITH PROPOFOL  Patient Location: PACU  Anesthesia Type:General  Level of Consciousness: awake, alert  and oriented  Airway & Oxygen Therapy: Patient Spontanous Breathing and Patient connected to face mask oxygen  Post-op Assessment: Report given to RN and Post -op Vital signs reviewed and stable  Post vital signs: Reviewed and stable  Last Vitals:  Vitals Value Taken Time  BP    Temp    Pulse    Resp    SpO2      Last Pain:  Vitals:   10/25/20 1239  TempSrc: Tympanic  PainSc: 1          Complications: No notable events documented.

## 2020-10-25 NOTE — Interval H&P Note (Signed)
History and Physical Interval Note:  10/25/2020 1:28 PM  Cole Velazquez  has presented today for surgery, with the diagnosis of Positive cologuard.  The various methods of treatment have been discussed with the patient and family. After consideration of risks, benefits and other options for treatment, the patient has consented to  Procedure(s): COLONOSCOPY WITH PROPOFOL (N/A) as a surgical intervention.  The patient's history has been reviewed, patient examined, no change in status, stable for surgery.  I have reviewed the patient's chart and labs.  Questions were answered to the patient's satisfaction.     [name]  Ok to proceed with colonoscopy

## 2020-10-25 NOTE — Anesthesia Preprocedure Evaluation (Signed)
Anesthesia Evaluation  Patient identified by MRN, date of birth, ID band Patient awake    Reviewed: Allergy & Precautions, NPO status , Patient's Chart, lab work & pertinent test results  Airway Mallampati: III  TM Distance: >3 FB Neck ROM: full    Dental  (+) Chipped   Pulmonary Current SmokerPatient did not abstain from smoking.,    Pulmonary exam normal        Cardiovascular negative cardio ROS Normal cardiovascular exam     Neuro/Psych Depression negative neurological ROS     GI/Hepatic negative GI ROS, Neg liver ROS,   Endo/Other  negative endocrine ROS  Renal/GU negative Renal ROS  negative genitourinary   Musculoskeletal   Abdominal   Peds  Hematology negative hematology ROS (+)   Anesthesia Other Findings Past Medical History: No date: Bipolar disorder ([place]) No date: COPD (chronic obstructive pulmonary disease) ([facility]) No date: Depression No date: GERD (gastroesophageal reflux disease) No date: High cholesterol No date: Psychiatric problem No date: Schizophrenia ([place]) No date: Sleep apnea  Past Surgical History: No date: CHOLECYSTECTOMY No date: TONSILLECTOMY  BMI    Body Mass Index: 35.44 kg/m      Reproductive/Obstetrics negative OB ROS                             Anesthesia Physical Anesthesia Plan  ASA: 2  Anesthesia Plan: General   Post-op Pain Management:    Induction:   PONV Risk Score and Plan: 2 and Propofol infusion and TIVA  Airway Management Planned:   Additional Equipment:   Intra-op Plan:   Post-operative Plan:   Informed Consent: I have reviewed the patients History and Physical, chart, labs and discussed the procedure including the risks, benefits and alternatives for the proposed anesthesia with the patient or authorized representative who has indicated his/her understanding and acceptance.     Dental Advisory Given  Plan Discussed  with: Anesthesiologist, CRNA and Surgeon  Anesthesia Plan Comments:         Anesthesia Quick Evaluation

## 2020-10-25 NOTE — H&P (Signed)
Outpatient short stay form Pre-procedure 10/25/2020 1:24 PM Raylene Miyamoto MD, MPH  Primary Physician: Dr. Doy Hutching  Reason for visit:  Positive Cologuard  History of present illness:   50 y/o gentleman with history of COPD here for colonoscopy due to positive cologuard test. No family history of GI malignancies. No blood thinners. History of cholecystectomy.    Current Facility-Administered Medications:    0.9 %  sodium chloride infusion, , Intravenous, Continuous, Gaylene Moylan, Hilton Cork, MD, Last Rate: 20 mL/hr at 10/25/20 1254, New Bag at 10/25/20 1254  Medications Prior to Admission  Medication Sig Dispense Refill Last Dose   ARIPiprazole (ABILIFY) 30 MG tablet Take by mouth.   10/25/2020 at 0600   Multiple Vitamin (MULTI-VITAMIN) tablet Take by mouth.   Past Week   venlafaxine XR (EFFEXOR-XR) 150 MG 24 hr capsule Take 150 mg by mouth every morning.   10/24/2020   ketoconazole (NIZORAL) 2 % cream Apply 1 application topically daily. (Patient not taking: Reported on 10/25/2020) 15 g 0 Completed Course   traMADol (ULTRAM) 50 MG tablet Take 1 tablet (50 mg total) by mouth every 6 (six) hours as needed. (Patient not taking: Reported on 10/25/2020) 20 tablet 0 Not Taking     No Known Allergies   Past Medical History:  Diagnosis Date   Bipolar disorder ([facility])    COPD (chronic obstructive pulmonary disease) ([facility])    Depression    GERD (gastroesophageal reflux disease)    High cholesterol    Psychiatric problem    Schizophrenia ([place])    Sleep apnea     Review of systems:  Otherwise negative.    Physical Exam  Gen: Alert, oriented. Appears stated age.  HEENT: [place]. Lungs: No respiratory distress CV: RRR Abd: soft, benign, no masses Ext: No edema    Planned procedures: Proceed with colonoscopy. The patient understands the nature of the planned procedure, indications, risks, alternatives and potential complications including but not limited to bleeding, infection, perforation,  damage to internal organs and possible oversedation/side effects from anesthesia. The patient agrees and gives consent to proceed.  Please refer to procedure notes for findings, recommendations and patient disposition/instructions.     Raylene Miyamoto MD, MPH Gastroenterology 10/25/2020  1:24 PM

## 2020-10-25 NOTE — Anesthesia Postprocedure Evaluation (Signed)
Anesthesia Post Note  Patient: Cole Velazquez  Procedure(s) Performed: COLONOSCOPY WITH PROPOFOL  Patient location during evaluation: Endoscopy Anesthesia Type: General Level of consciousness: awake and alert Pain management: pain level controlled Vital Signs Assessment: post-procedure vital signs reviewed and stable Respiratory status: spontaneous breathing, nonlabored ventilation, respiratory function stable and patient connected to nasal cannula oxygen Cardiovascular status: blood pressure returned to baseline and stable Postop Assessment: no apparent nausea or vomiting Anesthetic complications: no   No notable events documented.   Last Vitals:  Vitals:   10/25/20 1426 10/25/20 1436  BP: 113/78 113/80  Pulse: 71 68  Resp: 14 15  Temp:    SpO2: 98% 100%    Last Pain:  Vitals:   10/25/20 1436  TempSrc:   PainSc: 0-No pain                 Arita Miss

## 2020-10-26 ENCOUNTER — Encounter: Payer: Self-pay | Admitting: Gastroenterology

## 2020-10-27 LAB — SURGICAL PATHOLOGY

## 2021-06-16 IMAGING — US US SCROTUM W/ DOPPLER COMPLETE
1 series · 13 of 25 positions shown · non-contrast
Comparison: None.

CLINICAL DATA: Right scrotal swelling for 10 days.

EXAM:
SCROTAL ULTRASOUND
DOPPLER ULTRASOUND OF THE TESTICLES
TECHNIQUE: Complete ultrasound examination of the testicles, epididymis, and
other scrotal structures was performed. Color and spectral Doppler
ultrasound were also utilized to evaluate blood flow to the
testicles.

[Series 1: us scrotum w/doppler · 13 of 56 slices shown]
[im 1/56]
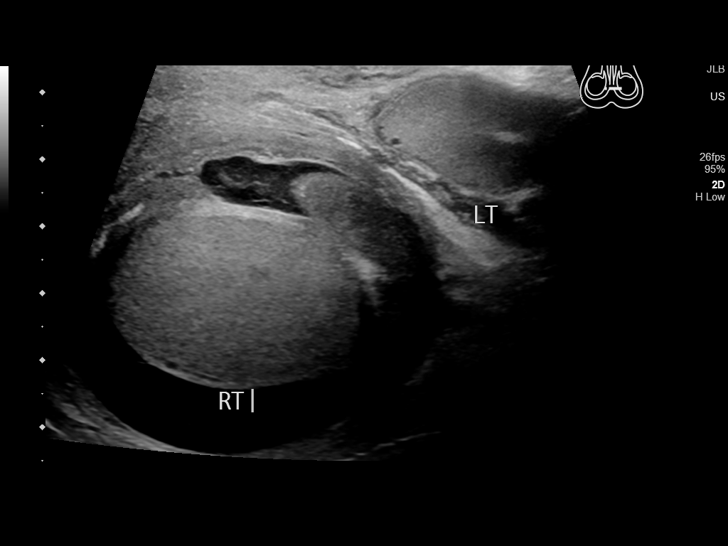
[im 5/56]
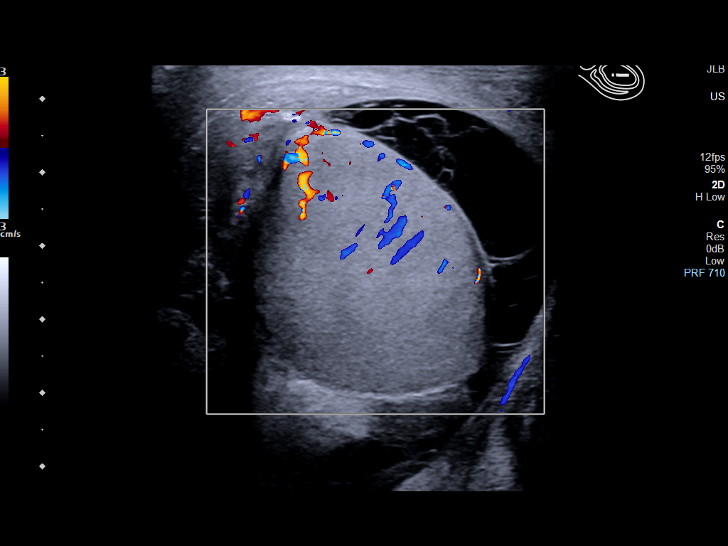
[im 10/56]
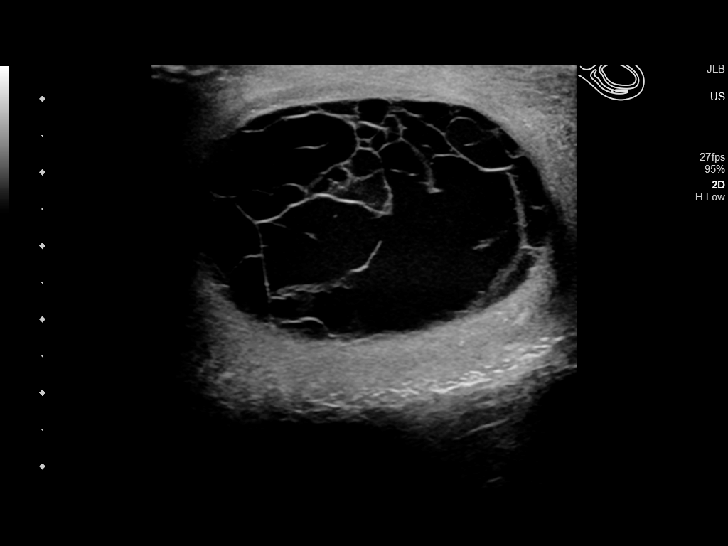
[im 14/56]
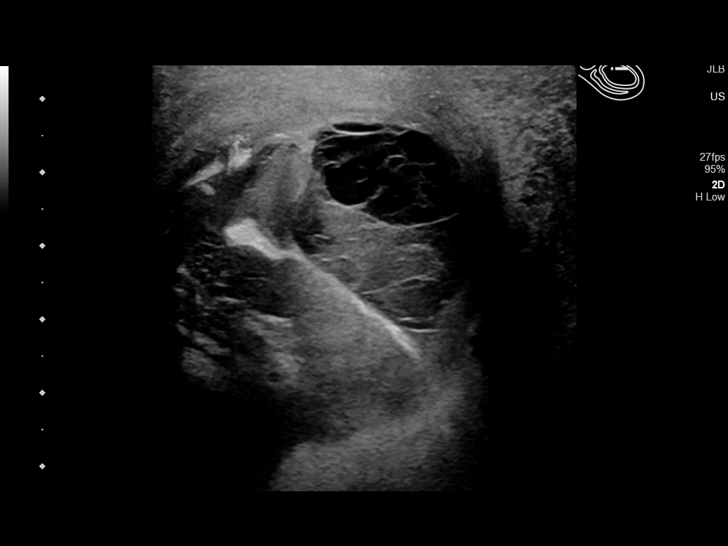
[im 19/56]
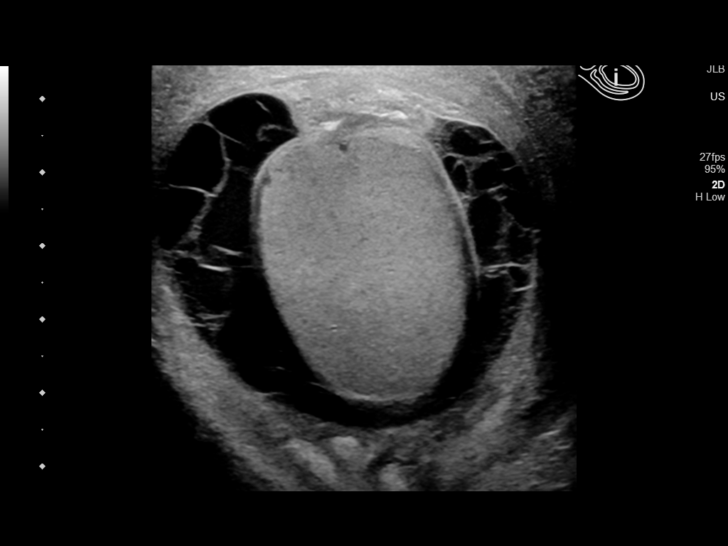
[im 23/56]
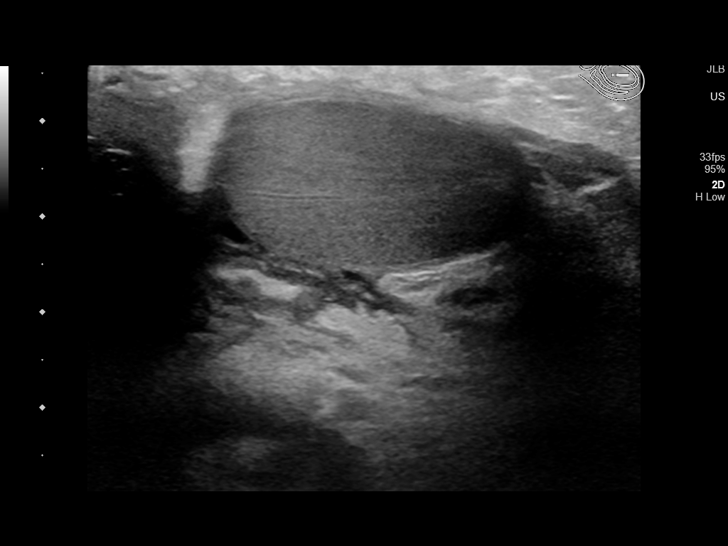
[im 28/56]
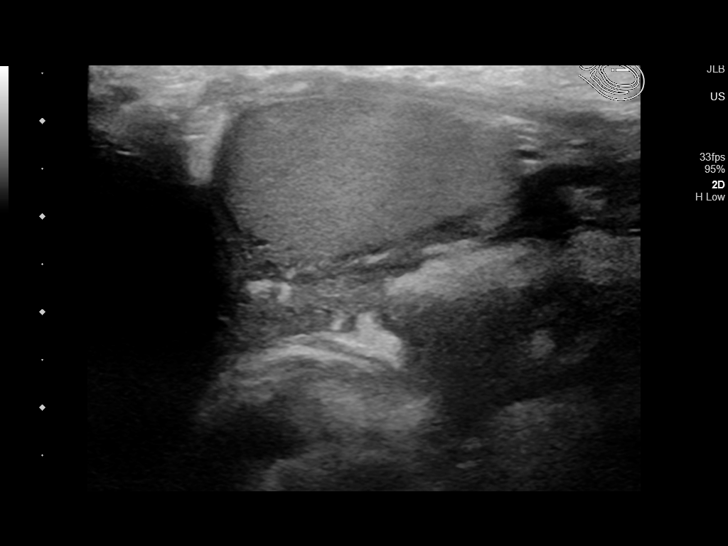
[im 33/56]
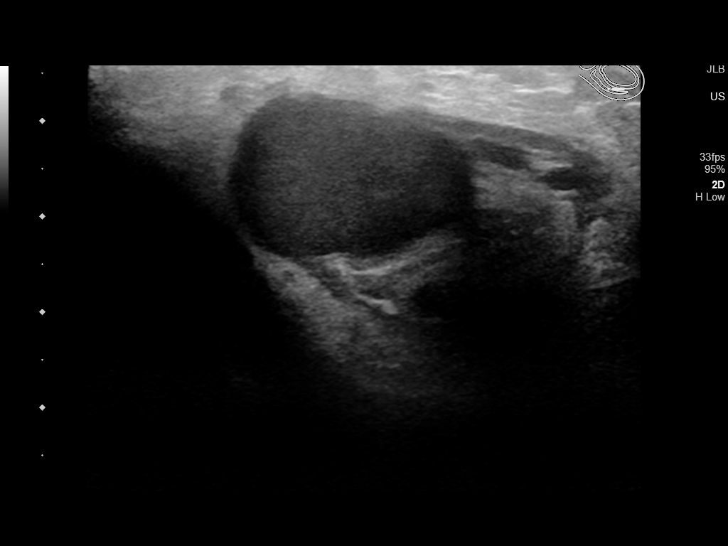
[im 37/56]
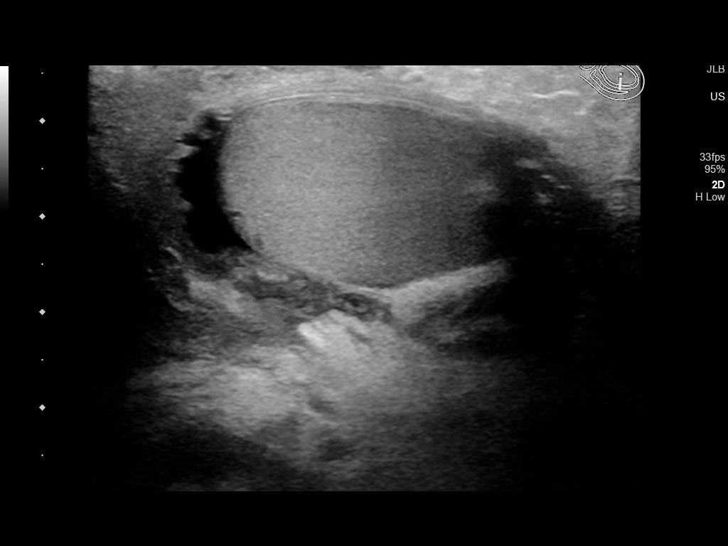
[im 42/56]
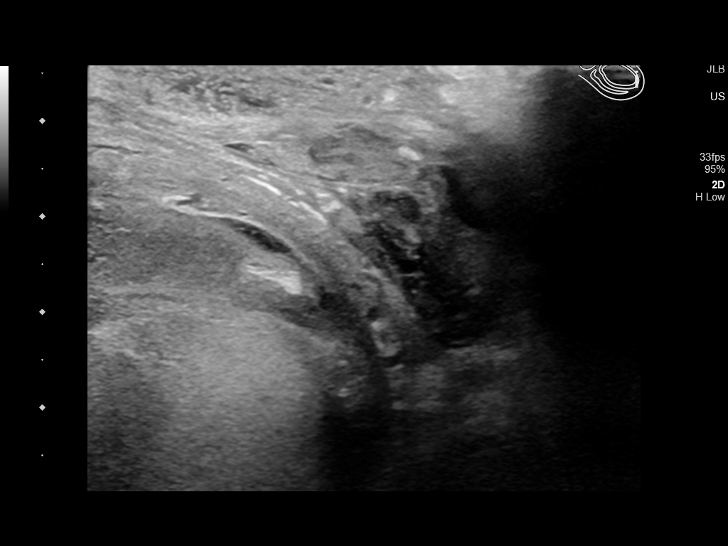
[im 46/56]
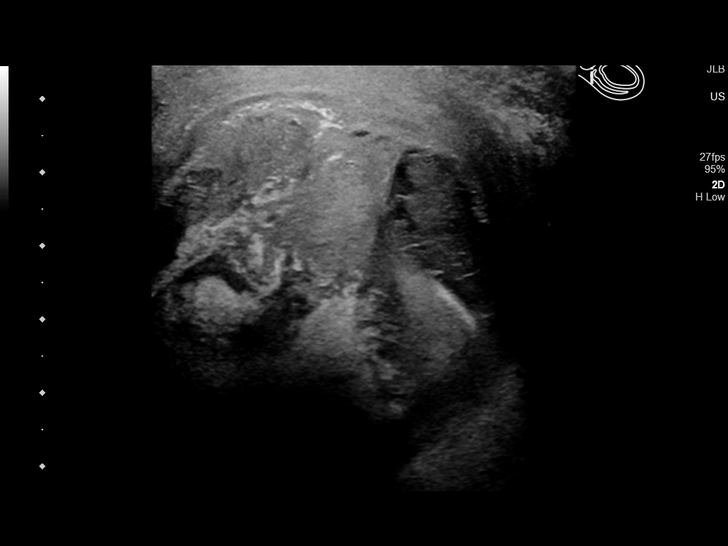
[im 51/56]
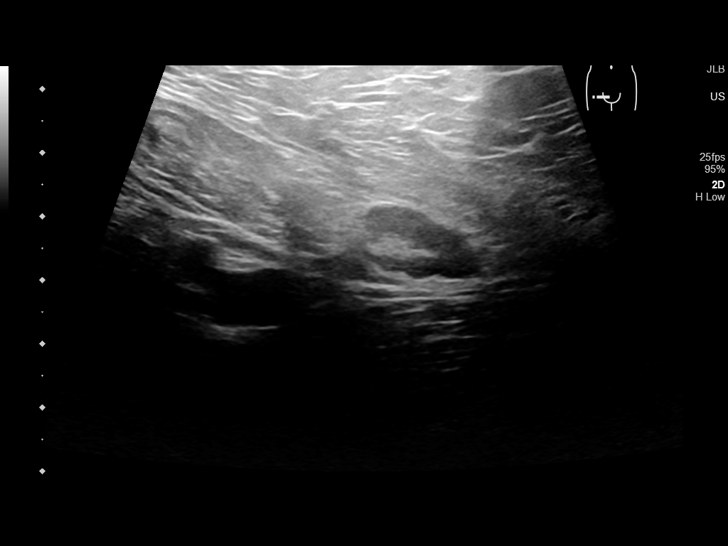
[im 56/56]
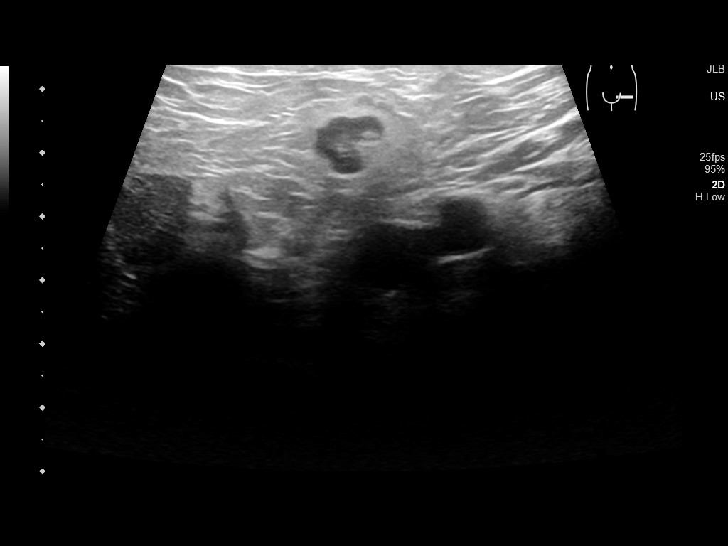

[13 of 25 positions shown; findings below may reference images not displayed]

FINDINGS: Right testicle

Measurements: 4.0 x 3.1 x 2.7 cm. No mass or microlithiasis
visualized.

Left testicle

Measurements: 3.2 x 1.7 x 3.0 cm. No mass or microlithiasis
visualized.

Right epididymis:  Not discretely visualized.

Left epididymis:  Not discretely visualized.

Hydrocele:  Large septated right hydrocele.  Small left hydrocele.

Varicocele:  None visualized.

Pulsed Doppler interrogation of both testes demonstrates normal low
resistance arterial and venous waveforms bilaterally.

Right-sided scrotal soft tissue swelling. No visualized inguinal
hernia.
IMPRESSION: 1. No acute finding. No evidence of testicular torsion or of
epididymitis/orchitis.
2. No testicular masses.
3. Large septated right sided hydrocele and right-sided scrotal soft
tissue swelling. Small left hydrocele. No other abnormalities.
4. No sonographic evidence of a hernia.

## 2022-04-28 ENCOUNTER — Emergency Department: Payer: Medicaid Other

## 2022-04-28 ENCOUNTER — Encounter: Payer: Self-pay | Admitting: Emergency Medicine

## 2022-04-28 ENCOUNTER — Emergency Department
Admission: EM | Admit: 2022-04-28 | Discharge: 2022-04-28 | Disposition: A | Discharge status: Home / Self Care | Payer: Medicaid Other | Attending: Emergency Medicine | Admitting: Emergency Medicine

## 2022-04-28 ENCOUNTER — Other Ambulatory Visit: Payer: Self-pay

## 2022-04-28 DIAGNOSIS — R079 Chest pain, unspecified: Secondary | ICD-10-CM

## 2022-04-28 DIAGNOSIS — J449 Chronic obstructive pulmonary disease, unspecified: Secondary | ICD-10-CM | POA: Diagnosis not present

## 2022-04-28 LAB — TROPONIN I (HIGH SENSITIVITY)
Troponin I (High Sensitivity): 3 ng/L (ref <18)
Troponin I (High Sensitivity): 4 ng/L (ref <18)

## 2022-04-28 LAB — BASIC METABOLIC PANEL
Anion gap: 6 (ref 5–15)
BUN: 25 mg/dL — ABNORMAL HIGH (ref 6–20)
CO2: 29 mmol/L (ref 22–32)
Calcium: 9.4 mg/dL (ref 8.9–10.3)
Chloride: 102 mmol/L (ref 98–111)
Creatinine, Ser: 0.91 mg/dL (ref 0.61–1.24)
GFR, Estimated: >60 mL/min (ref >60)
Glucose, Bld: 89 mg/dL (ref 70–99)
Potassium: 3.8 mmol/L (ref 3.5–5.1)
Sodium: 137 mmol/L (ref 135–145)

## 2022-04-28 LAB — CBC
HCT: 45 % (ref 39.0–52.0)
Hemoglobin: 14.9 g/dL (ref 13.0–17.0)
MCH: 31.1 pg (ref 26.0–34.0)
MCHC: 33.1 g/dL (ref 30.0–36.0)
MCV: 93.9 fL (ref 80.0–100.0)
Platelets: 263 10*3/uL (ref 150–400)
RBC: 4.79 MIL/uL (ref 4.22–5.81)
RDW: 14 % (ref 11.5–15.5)
WBC: 9.6 10*3/uL (ref 4.0–10.5)
nRBC: 0 % (ref 0.0–0.2)

## 2022-04-28 NOTE — ED Triage Notes (Signed)
Patient sent to ED from [facility] for CP. Patient states he had an episode of CP yesterday that started in his jaw and went into his stomach that lasted for approx 20 minutes. No CP today. No cardiac hx.

## 2022-04-28 NOTE — ED Provider Notes (Signed)
[facility] Provider Note    Event Date/Time   First MD Initiated Contact with Patient 04/28/22 1940     (approximate)   History   Chest Pain   HPI  MATISSE SALAIS is a 52 y.o. male with history of COPD, bipolar, GERD, schizophrenia, and remaining as listed in the EMR presents to the emergency department for evaluation of chest pain.  Chest pain was yesterday and states that started in his jaw went to the top part of his stomach. No sweating or vomiting. He has not had any pain today.     Physical Exam   Triage Vital Signs: ED Triage Vitals [04/28/22 1730]  Enc Vitals Group     BP 122/76     Pulse Rate 91     Resp 18     Temp 97.9 F (36.6 C)     Temp Source Oral     SpO2 100 %     Weight      Height      Head Circumference      Peak Flow      Pain Score 0     Pain Loc      Pain Edu?      Excl. in [place]?     Most recent vital signs: Vitals:   04/28/22 1730  BP: 122/76  Pulse: 91  Resp: 18  Temp: 97.9 F (36.6 C)  SpO2: 100%     General: Awake, no distress.  CV:  Good peripheral perfusion.  Resp:  Normal effort.  Abd:  No distention.  Other:     ED Results / Procedures / Treatments   Labs (all labs ordered are listed, but only abnormal results are displayed) Labs Reviewed  BASIC METABOLIC PANEL - Abnormal; Notable for the following components:      Result Value   BUN 25 (*)    All other components within normal limits  CBC  TROPONIN I (HIGH SENSITIVITY)  TROPONIN I (HIGH SENSITIVITY)     EKG  Ventricular rate of 93, sinus rhythm, incomplete right bundle branch block   RADIOLOGY  Chest x-ray negative for acute cardiopulmonary abnormality.  Image reviewed and interpreted by me.  Radiology report consistent with the same.  PROCEDURES:  Critical Care performed: No  Procedures   MEDICATIONS ORDERED IN ED: Medications - No data to display   IMPRESSION / MDM / [place] / ED COURSE  I reviewed  the triage vital signs and the nursing notes.                              Differential diagnosis includes, but is not limited to, CAD, anxiety, angina, MI, NSTEMI, GERD, musculoskeletal pain, electrolyte dysfunction  Patient's presentation is most consistent with acute illness / injury with system symptoms.  52 year old male presenting to the emergency department for treatment and evaluation of chest pain that happened yesterday.  See HPI for further details.  Labs including troponin are all reassuring.  Chest x-ray is normal.  EKG is without acute concerns.  Patient has had no chest pain today.  Plan will be to discharge him home.  ER return precautions were discussed.     FINAL CLINICAL IMPRESSION(S) / ED DIAGNOSES   Final diagnoses:  Nonspecific chest pain     Rx / DC Orders   ED Discharge Orders     None        Note:  This document was prepared using Dragon voice recognition software and may include unintentional dictation errors.   Victorino Dike, FNP 04/28/22 2018    Carrie Mew, MD 05/03/22 [phone]

## 2023-11-29 ENCOUNTER — Ambulatory Visit: Payer: MEDICAID

## 2023-11-29 DIAGNOSIS — K640 First degree hemorrhoids: Secondary | ICD-10-CM | POA: Diagnosis not present

## 2023-11-29 DIAGNOSIS — D124 Benign neoplasm of descending colon: Secondary | ICD-10-CM | POA: Diagnosis not present

## 2023-11-29 DIAGNOSIS — Z1211 Encounter for screening for malignant neoplasm of colon: Secondary | ICD-10-CM | POA: Diagnosis present

## 2023-11-29 DIAGNOSIS — Z860100 Personal history of colon polyps, unspecified: Secondary | ICD-10-CM | POA: Diagnosis not present

## 2024-02-02 ENCOUNTER — Emergency Department
Admission: EM | Admit: 2024-02-02 | Discharge: 2024-02-02 | Disposition: A | Discharge status: Home / Self Care | Payer: Worker's Compensation | Attending: Emergency Medicine | Admitting: Emergency Medicine

## 2024-02-02 ENCOUNTER — Emergency Department: Payer: Worker's Compensation

## 2024-02-02 DIAGNOSIS — S61210A Laceration without foreign body of right index finger without damage to nail, initial encounter: Secondary | ICD-10-CM | POA: Insufficient documentation

## 2024-02-02 DIAGNOSIS — Y92512 Supermarket, store or market as the place of occurrence of the external cause: Secondary | ICD-10-CM | POA: Diagnosis not present

## 2024-02-02 DIAGNOSIS — Y990 Civilian activity done for income or pay: Secondary | ICD-10-CM | POA: Insufficient documentation

## 2024-02-02 DIAGNOSIS — Z23 Encounter for immunization: Secondary | ICD-10-CM | POA: Insufficient documentation

## 2024-02-02 DIAGNOSIS — W268XXA Contact with other sharp object(s), not elsewhere classified, initial encounter: Secondary | ICD-10-CM | POA: Insufficient documentation

## 2024-02-02 DIAGNOSIS — S6991XA Unspecified injury of right wrist, hand and finger(s), initial encounter: Secondary | ICD-10-CM | POA: Diagnosis present

## 2024-02-02 MED ORDER — TETANUS-DIPHTH-ACELL PERTUSSIS 5-2-15.5 LF-MCG/0.5 IM SUSP
0.5000 mL | Freq: Once | INTRAMUSCULAR | Status: AC
  Administered 2024-02-02: 0.5 mL via INTRAMUSCULAR
  Filled 2024-02-02: qty 0.5

## 2024-02-02 MED ORDER — LIDOCAINE HCL (PF) 1 % IJ SOLN
5.0000 mL | Freq: Once | INTRAMUSCULAR | Status: AC
  Administered 2024-02-02: 5 mL via INTRADERMAL
  Filled 2024-02-02: qty 5

## 2024-02-02 NOTE — ED Triage Notes (Signed)
 Pt. In via pov from work at [employer] for laceration to right pointer finger, stated he was sweeping and the metal broom broke in half and cut him, states his tetanus is UTD, Workmans comp case # [id number], and nurse # [phone] provided to ER by patient per his supervisor

## 2024-02-02 NOTE — Discharge Instructions (Signed)
 Keep the bandage on for the next 24 to 48 hours.  After that you may remove the bandage and gently wash your finger with soap and water.  Do not soak your finger down in dishwater, swimming pool etc.  You may take a shower.  Try to keep the areas dry as possible while in the shower.  Sutures should be removed in 7 to 10 days.  He can go to urgent care, the emergency department, or your regular doctor to have these removed

## 2024-02-02 NOTE — ED Notes (Signed)
 PT in no acute distress prior to discharge. Discharged instructions reviewed, all questions answered and pt verbalized understanding at this time. Pt has all belongings with them at time of discharge. Non-adherent bandage applied to pts finger prior to discharge

## 2024-02-02 NOTE — ED Provider Notes (Signed)
 [facility] Provider Note    Event Date/Time   First MD Initiated Contact with Patient 02/02/24 2158     (approximate)   History   Laceration   HPI  Cole Velazquez is a 53 y.o. male with no significant past medical history, see past medical history on chart, presents emergency department from [employer].  Patient works cleaning in the store.  States a metal broom broke in half and cut his finger.  Unsure of his last tetanus as he states the last time he got 1 was here.  This is a Teacher, Adult Education. case.  Patient is right-handed.  No numbness tingling.      Physical Exam   Triage Vital Signs: ED Triage Vitals  Encounter Vitals Group     BP 02/02/24 2040 (!) 160/98     Girls Systolic BP Percentile --      Girls Diastolic BP Percentile --      Boys Systolic BP Percentile --      Boys Diastolic BP Percentile --      Pulse Rate 02/02/24 2040 81     Resp 02/02/24 2040 17     Temp 02/02/24 2040 98.5 F (36.9 C)     Temp Source 02/02/24 2040 Oral     SpO2 02/02/24 2040 100 %     Weight 02/02/24 2036 220 lb (99.8 kg)     Height 02/02/24 2036 5' 9 (1.753 m)     Head Circumference --      Peak Flow --      Pain Score 02/02/24 2036 4     Pain Loc --      Pain Education --      Exclude from Growth Chart --     Most recent vital signs: Vitals:   02/02/24 2040  BP: (!) 160/98  Pulse: 81  Resp: 17  Temp: 98.5 F (36.9 C)  SpO2: 100%     General: Awake, no distress.   CV:  Good peripheral perfusion. Resp:  Normal effort.  Abd:  No distention.   Other:  Laceration noted to the right index finger, no foreign body, full range of motion, neurovascular intact, active bleeding noted   ED Results / Procedures / Treatments   Labs (all labs ordered are listed, but only abnormal results are displayed) Labs Reviewed - No data to display   EKG     RADIOLOGY X-ray right index finger    PROCEDURES:   .Laceration Repair  Date/Time:  02/02/2024 11:43 PM  Performed by: Gasper Devere ORN, PA-C Authorized by: Gasper Devere ORN, PA-C   Consent:    Consent obtained:  Verbal   Consent given by:  Patient   Risks, benefits, and alternatives were discussed: yes     Risks discussed:  Infection, pain, retained foreign body, tendon damage, poor cosmetic result, need for additional repair, nerve damage, poor wound healing and vascular damage   Alternatives discussed:  No treatment Universal protocol:    Procedure explained and questions answered to patient or proxy's satisfaction: yes     Immediately prior to procedure, a time out was called: yes     Patient identity confirmed:  Verbally with patient Anesthesia:    Anesthesia method:  Nerve block   Block location:  Right index finger   Block needle gauge:  27 G   Block anesthetic:  Lidocaine 1% w/o epi   Block technique:  Digital   Block injection procedure:  Anatomic landmarks identified, introduced  needle, incremental injection, anatomic landmarks palpated and negative aspiration for blood   Block outcome:  Anesthesia achieved Laceration details:    Location:  Finger   Finger location:  R index finger   Length (cm):  8 Pre-procedure details:    Preparation:  Patient was prepped and draped in usual sterile fashion and imaging obtained to evaluate for foreign bodies Exploration:    Limited defect created (wound extended): no     Hemostasis achieved with:  Direct pressure   Imaging obtained: x-ray     Imaging outcome: foreign body not noted     Wound exploration: wound explored through full range of motion and entire depth of wound visualized     Wound extent: areolar tissue not violated, fascia not violated, no foreign body, no signs of injury, no nerve damage, no tendon damage, no underlying fracture and no vascular damage     Contaminated: no   Treatment:    Area cleansed with:  Povidone-iodine and saline   Amount of cleaning:  Standard   Irrigation solution:  Sterile  saline   Irrigation method:  Syringe and tap   Debridement:  None   Undermining:  None   Scar revision: no   Skin repair:    Repair method:  Sutures   Suture size:  5-0   Suture material:  Nylon   Suture technique:  Simple interrupted   Number of sutures:  8 Approximation:    Approximation:  Close Repair type:    Repair type:  Simple Post-procedure details:    Dressing:  Non-adherent dressing   Procedure completion:  Tolerated well, no immediate complications   Critical Care: No Chief Complaint  Patient presents with   Laceration      MEDICATIONS ORDERED IN ED: Medications  lidocaine (PF) (XYLOCAINE) 1 % injection 5 mL (5 mLs Intradermal Given 02/02/24 2258)  Tdap (ADACEL) injection 0.5 mL (0.5 mLs Intramuscular Given 02/02/24 2257)     IMPRESSION / MDM / ASSESSMENT AND PLAN / ED COURSE  I reviewed the triage vital signs and the nursing notes.                              Differential diagnosis includes, but is not limited to, laceration, abrasion, fracture, contusion, foreign body, tendon injury  Patient's presentation is most consistent with acute illness / injury with system symptoms.   Medications given: Tdap updated  X-ray right index finger independently reviewed interpreted by me as being negative for any acute abnormality  See procedure note for laceration repair   Patient tolerated procedure well.  Work restrictions provided on a work note.  Follow-up with urgent care, emergency department or his regular doctor in 7 to 10 days for suture removal.  If he sees any sign of infection he should return emergency department.  He is in agreement treatment plan.  Discharged stable condition.   FINAL CLINICAL IMPRESSION(S) / ED DIAGNOSES   Final diagnoses:  Laceration of right index finger without foreign body without damage to nail, initial encounter     Rx / DC Orders   ED Discharge Orders     None        Note:  This document was prepared using  Dragon voice recognition software and may include unintentional dictation errors.    Gasper Devere ORN, PA-C 02/02/24 2345    Waymond Lorelle Cummins, MD 02/04/24 [phone]
